# Patient Record
Sex: Female | Born: 1985 | Race: Black or African American | Hispanic: No | Marital: Single | Smoking: Former smoker
Health system: Southern US, Community
[De-identification: ages and names within clinical notes are randomized; demographics above are authoritative.]

---

## 2010-10-03 ENCOUNTER — Other Ambulatory Visit: Payer: Self-pay | Admitting: Obstetrics and Gynecology

## 2010-10-03 ENCOUNTER — Other Ambulatory Visit (HOSPITAL_COMMUNITY)
Admission: RE | Admit: 2010-10-03 | Discharge: 2010-10-03 | Disposition: A | Discharge status: Home / Self Care | Payer: Medicaid Other | Source: Ambulatory Visit | Attending: Obstetrics & Gynecology | Admitting: Obstetrics & Gynecology

## 2010-10-03 ENCOUNTER — Other Ambulatory Visit (HOSPITAL_BASED_OUTPATIENT_CLINIC_OR_DEPARTMENT_OTHER): Payer: Self-pay | Admitting: Obstetrics & Gynecology

## 2010-10-03 DIAGNOSIS — E049 Nontoxic goiter, unspecified: Secondary | ICD-10-CM

## 2010-10-03 DIAGNOSIS — Z113 Encounter for screening for infections with a predominantly sexual mode of transmission: Secondary | ICD-10-CM | POA: Insufficient documentation

## 2010-10-03 DIAGNOSIS — Z124 Encounter for screening for malignant neoplasm of cervix: Secondary | ICD-10-CM | POA: Insufficient documentation

## 2010-10-04 ENCOUNTER — Other Ambulatory Visit (HOSPITAL_BASED_OUTPATIENT_CLINIC_OR_DEPARTMENT_OTHER): Payer: Self-pay

## 2010-10-09 ENCOUNTER — Other Ambulatory Visit (HOSPITAL_BASED_OUTPATIENT_CLINIC_OR_DEPARTMENT_OTHER): Payer: Self-pay

## 2010-10-16 ENCOUNTER — Ambulatory Visit (HOSPITAL_BASED_OUTPATIENT_CLINIC_OR_DEPARTMENT_OTHER)
Admission: RE | Admit: 2010-10-16 | Discharge: 2010-10-16 | Disposition: A | Discharge status: Home / Self Care | Payer: Medicaid Other | Source: Ambulatory Visit | Attending: Obstetrics and Gynecology | Admitting: Obstetrics and Gynecology

## 2010-10-16 DIAGNOSIS — E049 Nontoxic goiter, unspecified: Secondary | ICD-10-CM | POA: Insufficient documentation

## 2011-02-24 ENCOUNTER — Emergency Department (HOSPITAL_COMMUNITY)
Admission: EM | Admit: 2011-02-24 | Discharge: 2011-02-24 | Disposition: A | Discharge status: Home / Self Care | Payer: Medicaid Other | Attending: Emergency Medicine | Admitting: Emergency Medicine

## 2011-02-24 ENCOUNTER — Emergency Department (HOSPITAL_COMMUNITY): Payer: Medicaid Other

## 2011-02-24 ENCOUNTER — Encounter: Payer: Self-pay | Admitting: *Deleted

## 2011-02-24 DIAGNOSIS — M25559 Pain in unspecified hip: Secondary | ICD-10-CM | POA: Insufficient documentation

## 2011-02-24 DIAGNOSIS — R079 Chest pain, unspecified: Secondary | ICD-10-CM | POA: Insufficient documentation

## 2011-02-24 DIAGNOSIS — Y92410 Unspecified street and highway as the place of occurrence of the external cause: Secondary | ICD-10-CM | POA: Insufficient documentation

## 2011-02-24 DIAGNOSIS — S0003XA Contusion of scalp, initial encounter: Secondary | ICD-10-CM | POA: Insufficient documentation

## 2011-02-24 DIAGNOSIS — T148XXA Other injury of unspecified body region, initial encounter: Secondary | ICD-10-CM | POA: Insufficient documentation

## 2011-02-24 MED ORDER — HYDROCODONE-ACETAMINOPHEN 5-325 MG PO TABS: 1.0000 | ORAL_TABLET | ORAL | Status: AC | PRN

## 2011-02-24 MED ORDER — CYCLOBENZAPRINE HCL 10 MG PO TABS: 10.0000 mg | ORAL_TABLET | Freq: Three times a day (TID) | ORAL | Status: AC | PRN

## 2011-02-24 MED ORDER — OXYCODONE-ACETAMINOPHEN 5-325 MG PO TABS
1.0000 | ORAL_TABLET | Freq: Once | ORAL | Status: AC
  Administered 2011-02-24: 1 via ORAL
  Filled 2011-02-24: qty 1

## 2011-02-24 MED ORDER — IBUPROFEN 800 MG PO TABS: 800.0000 mg | ORAL_TABLET | Freq: Three times a day (TID) | ORAL | Status: AC
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
## 2011-02-24 NOTE — ED Notes (Signed)
Bed:[facility]<BR> Expected date:<BR> Expected time:<BR> Means of arrival:<BR> Comments:<BR> EMS
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
## 2011-02-24 NOTE — ED Notes (Signed)
Patient given discharge instructions, information, prescriptions, and diet order. Patient states that they adequately understand discharge information given and to return to ED if symptoms return or worsen.     

## 2011-02-24 NOTE — ED Notes (Signed)
Pt ambulated to restroom with no assistance. ?

## 2011-02-27 NOTE — ED Provider Notes (Signed)
Medical screening examination/treatment/procedure(s) were performed by non-physician practitioner and as supervising physician I was immediately available for consultation/collaboration.   Rolan Bucco, MD 02/27/11 [phone]

## 2011-03-09 ENCOUNTER — Emergency Department: Payer: Self-pay | Admitting: Unknown Physician Specialty

## 2011-09-29 ENCOUNTER — Emergency Department: Payer: Self-pay | Admitting: *Deleted

## 2014-04-08 ENCOUNTER — Emergency Department: Payer: Self-pay | Admitting: Emergency Medicine

## 2014-04-08 LAB — URINALYSIS, COMPLETE
Bilirubin,UR: NEGATIVE
Blood: NEGATIVE
Glucose,UR: NEGATIVE mg/dL (ref 0–75)
Ketone: NEGATIVE
NITRITE: POSITIVE
PH: 6 (ref 4.5–8.0)
PROTEIN: NEGATIVE
Specific Gravity: 1.024 (ref 1.003–1.030)
Squamous Epithelial: 4
WBC UR: 11 /HPF (ref 0–5)

## 2014-04-08 LAB — COMPREHENSIVE METABOLIC PANEL
ALBUMIN: 3.5 g/dL (ref 3.4–5.0)
ALK PHOS: 91 U/L (ref 46–116)
Anion Gap: 5 — ABNORMAL LOW (ref 7–16)
BILIRUBIN TOTAL: 0.2 mg/dL (ref 0.2–1.0)
BUN: 17 mg/dL (ref 7–18)
CHLORIDE: 108 mmol/L — AB (ref 98–107)
CO2: 26 mmol/L (ref 21–32)
Calcium, Total: 8.7 mg/dL (ref 8.5–10.1)
Creatinine: 0.81 mg/dL (ref 0.60–1.30)
EGFR (African American): >60
EGFR (Non-African Amer.): >60
Glucose: 77 mg/dL (ref 65–99)
OSMOLALITY: 278 (ref 275–301)
Potassium: 3.8 mmol/L (ref 3.5–5.1)
SGOT(AST): 21 U/L (ref 15–37)
SGPT (ALT): 13 U/L — ABNORMAL LOW (ref 14–63)
Sodium: 139 mmol/L (ref 136–145)
TOTAL PROTEIN: 8 g/dL (ref 6.4–8.2)

## 2014-04-08 LAB — CBC WITH DIFFERENTIAL/PLATELET
BASOS ABS: 0.1 10*3/uL (ref 0.0–0.1)
BASOS PCT: 0.8 %
EOS PCT: 5 %
Eosinophil #: 0.4 10*3/uL (ref 0.0–0.7)
HCT: 40.6 % (ref 35.0–47.0)
HGB: 13.1 g/dL (ref 12.0–16.0)
LYMPHS ABS: 2.8 10*3/uL (ref 1.0–3.6)
LYMPHS PCT: 32.7 %
MCH: 28 pg (ref 26.0–34.0)
MCHC: 32.2 g/dL (ref 32.0–36.0)
MCV: 87 fL (ref 80–100)
MONOS PCT: 7.1 %
Monocyte #: 0.6 x10 3/mm (ref 0.2–0.9)
NEUTROS ABS: 4.6 10*3/uL (ref 1.4–6.5)
Neutrophil %: 54.4 %
Platelet: 290 10*3/uL (ref 150–440)
RBC: 4.68 10*6/uL (ref 3.80–5.20)
RDW: 13.2 % (ref 11.5–14.5)
WBC: 8.4 10*3/uL (ref 3.6–11.0)

## 2014-04-09 LAB — GC/CHLAMYDIA PROBE AMP

## 2014-04-09 LAB — WET PREP, GENITAL

## 2014-08-05 ENCOUNTER — Emergency Department: Payer: Medicaid Other

## 2014-08-05 ENCOUNTER — Encounter: Payer: Self-pay | Admitting: *Deleted

## 2014-08-05 ENCOUNTER — Other Ambulatory Visit: Payer: Self-pay

## 2014-08-05 ENCOUNTER — Emergency Department
Admission: EM | Admit: 2014-08-05 | Discharge: 2014-08-05 | Disposition: A | Discharge status: Home / Self Care | Payer: Medicaid Other | Attending: Student | Admitting: Student

## 2014-08-05 DIAGNOSIS — R112 Nausea with vomiting, unspecified: Secondary | ICD-10-CM

## 2014-08-05 DIAGNOSIS — R109 Unspecified abdominal pain: Secondary | ICD-10-CM | POA: Diagnosis present

## 2014-08-05 DIAGNOSIS — Z3202 Encounter for pregnancy test, result negative: Secondary | ICD-10-CM | POA: Diagnosis not present

## 2014-08-05 DIAGNOSIS — Z87891 Personal history of nicotine dependence: Secondary | ICD-10-CM | POA: Diagnosis not present

## 2014-08-05 DIAGNOSIS — Z880 Allergy status to penicillin: Secondary | ICD-10-CM | POA: Diagnosis not present

## 2014-08-05 DIAGNOSIS — B349 Viral infection, unspecified: Secondary | ICD-10-CM | POA: Diagnosis not present

## 2014-08-05 DIAGNOSIS — R0789 Other chest pain: Secondary | ICD-10-CM | POA: Diagnosis not present

## 2014-08-05 LAB — COMPREHENSIVE METABOLIC PANEL
ALBUMIN: 4 g/dL (ref 3.5–5.0)
ALK PHOS: 103 U/L (ref 38–126)
ALT: 19 U/L (ref 14–54)
AST: 22 U/L (ref 15–41)
Anion gap: 6 (ref 5–15)
BILIRUBIN TOTAL: 0.3 mg/dL (ref 0.3–1.2)
BUN: 15 mg/dL (ref 6–20)
CALCIUM: 8.6 mg/dL — AB (ref 8.9–10.3)
CO2: 27 mmol/L (ref 22–32)
Chloride: 104 mmol/L (ref 101–111)
Creatinine, Ser: 0.79 mg/dL (ref 0.44–1.00)
GFR calc non Af Amer: >60 mL/min (ref >60)
GLUCOSE: 88 mg/dL (ref 65–99)
Potassium: 3.4 mmol/L — ABNORMAL LOW (ref 3.5–5.1)
Sodium: 137 mmol/L (ref 135–145)
Total Protein: 8.4 g/dL — ABNORMAL HIGH (ref 6.5–8.1)

## 2014-08-05 LAB — CBC WITH DIFFERENTIAL/PLATELET
Basophils Absolute: 0 10*3/uL (ref 0–0.1)
Basophils Relative: 1 %
EOS PCT: 6 %
Eosinophils Absolute: 0.5 10*3/uL (ref 0–0.7)
HCT: 39.8 % (ref 35.0–47.0)
HEMOGLOBIN: 12.8 g/dL (ref 12.0–16.0)
LYMPHS PCT: 23 %
Lymphs Abs: 1.8 10*3/uL (ref 1.0–3.6)
MCH: 28 pg (ref 26.0–34.0)
MCHC: 32.2 g/dL (ref 32.0–36.0)
MCV: 86.7 fL (ref 80.0–100.0)
MONO ABS: 0.6 10*3/uL (ref 0.2–0.9)
MONOS PCT: 7 %
NEUTROS ABS: 5.3 10*3/uL (ref 1.4–6.5)
NEUTROS PCT: 65 %
Platelets: 301 10*3/uL (ref 150–440)
RBC: 4.59 MIL/uL (ref 3.80–5.20)
RDW: 13.3 % (ref 11.5–14.5)
WBC: 8.2 10*3/uL (ref 3.6–11.0)

## 2014-08-05 LAB — POCT PREGNANCY, URINE: PREG TEST UR: NEGATIVE

## 2014-08-05 LAB — URINALYSIS COMPLETE WITH MICROSCOPIC (ARMC ONLY)
BILIRUBIN URINE: NEGATIVE
Glucose, UA: NEGATIVE mg/dL
KETONES UR: NEGATIVE mg/dL
LEUKOCYTES UA: NEGATIVE
Nitrite: NEGATIVE
PH: 6 (ref 5.0–8.0)
Protein, ur: NEGATIVE mg/dL
Specific Gravity, Urine: 1.015 (ref 1.005–1.030)

## 2014-08-05 LAB — TROPONIN I

## 2014-08-05 LAB — LIPASE, BLOOD: LIPASE: 35 U/L (ref 22–51)

## 2014-08-05 MED ORDER — ONDANSETRON 4 MG PO TBDP: 4.0000 mg | ORAL_TABLET | Freq: Three times a day (TID) | ORAL | Status: DC | PRN

## 2014-08-05 MED ORDER — IBUPROFEN 600 MG PO TABS
ORAL_TABLET | ORAL | Status: AC
  Administered 2014-08-05: 600 mg via ORAL
  Filled 2014-08-05: qty 1

## 2014-08-05 MED ORDER — IBUPROFEN 600 MG PO TABS: 600.0000 mg | ORAL_TABLET | Freq: Four times a day (QID) | ORAL | Status: DC | PRN

## 2014-08-05 MED ORDER — IBUPROFEN 600 MG PO TABS
600.0000 mg | ORAL_TABLET | Freq: Once | ORAL | Status: AC
  Administered 2014-08-05: 600 mg via ORAL

## 2014-08-05 MED ORDER — BENZONATATE 100 MG PO CAPS: 100.0000 mg | ORAL_CAPSULE | Freq: Three times a day (TID) | ORAL | Status: DC | PRN

## 2014-08-05 MED ORDER — ACETAMINOPHEN 500 MG PO TABS
1000.0000 mg | ORAL_TABLET | Freq: Once | ORAL | Status: AC
  Administered 2014-08-05: 1000 mg via ORAL

## 2014-08-05 MED ORDER — ONDANSETRON 4 MG PO TBDP
4.0000 mg | ORAL_TABLET | Freq: Once | ORAL | Status: AC
  Administered 2014-08-05: 4 mg via ORAL

## 2014-08-05 MED ORDER — ONDANSETRON 4 MG PO TBDP
ORAL_TABLET | ORAL | Status: AC
  Administered 2014-08-05: 4 mg via ORAL
  Filled 2014-08-05: qty 1

## 2014-08-05 MED ORDER — ACETAMINOPHEN 500 MG PO TABS
ORAL_TABLET | ORAL | Status: AC
Start: 2014-08-05 — End: 2014-08-05
  Administered 2014-08-05: 1000 mg via ORAL
  Filled 2014-08-05: qty 2

## 2014-08-05 NOTE — ED Provider Notes (Signed)
Laura Salinas  ____________________________________________  Time seen: Approximately 12:07 PM  I have reviewed the triage vital signs and the nursing notes.   HISTORY  Chief Complaint Abdominal Pain    HPI Laura Salinas is a 29 y.o. female with no chronic medical problems who presents for evaluation of 2 days of dry cough, runny nose, intermittent sharp chest pain, cramping abdominal pain, nonbloody nonbilious emesis. Last night she had intermittent sharp chest pains in the middle of her chest which did not radiate and lasted only a few seconds before they resolved. She is not had any chest pain today. The remainder of her symptoms have been constant since onset. He reports she has felt "hot and cold" but has not documented a fever with a thermometer. She denies any sick contacts. No diarrhea. No abnormal vaginal bleeding or vaginal discharge. She reported that her chest pain last night was not pleuritic but every time she tried to take a deep breath, it would make her cough. No modifying factors. Current severity is moderate.   History reviewed. No pertinent past medical history.  There are no active problems to display for this patient.   History reviewed. No pertinent past surgical history.  Current Outpatient Rx  Name  Route  Sig  Dispense  Refill  . benzonatate (TESSALON PERLES) 100 MG capsule   Oral   Take 1 capsule (100 mg total) by mouth 3 (three) times daily as needed for cough.   15 capsule   0   . ibuprofen (ADVIL,MOTRIN) 600 MG tablet   Oral   Take 1 tablet (600 mg total) by mouth every 6 (six) hours as needed for moderate pain.   15 tablet   0   . ondansetron (ZOFRAN ODT) 4 MG disintegrating tablet   Oral   Take 1 tablet (4 mg total) by mouth every 8 (eight) hours as needed for nausea or vomiting.   12 tablet   0     Allergies Penicillins  No family history on file.  Social History History   Substance Use Topics  . Smoking status: Former Games developermoker  . Smokeless tobacco: Not on file  . Alcohol Use: No    Review of Systems Constitutional: + subjective fever/chills Eyes: No visual changes. ENT: No sore throat. Cardiovascular: + chest pain. Respiratory: Denies shortness of breath. Gastrointestinal: + abdominal pain.  + nausea, + vomiting.  No diarrhea.  No constipation. Genitourinary: Negative for dysuria. Musculoskeletal: Negative for back pain. Skin: Negative for rash. Neurological: Negative for headaches, focal weakness or numbness.  10-point ROS otherwise negative.  ____________________________________________   PHYSICAL EXAM:  VITAL SIGNS: ED Triage Vitals  Enc Vitals Group     BP 08/05/14 0748 132/7 mmHg     Pulse Rate 08/05/14 0748 71     Resp 08/05/14 0748 20     Temp 08/05/14 0748 97.6 F (36.4 C)     Temp Source 08/05/14 0748 Oral     SpO2 08/05/14 1114 100 %     Weight 08/05/14 0748 162 lb (73.483 kg)     Height 08/05/14 0748 4\' 9"  (1.448 m)     Head Cir --      Peak Flow --      Pain Score 08/05/14 0749 8     Pain Loc --      Pain Edu? --      Excl. in GC? --     Constitutional: Alert and oriented. Well appearing and in  no acute distress. Frequent dry cough. Eyes: Conjunctivae are normal. PERRL. EOMI. Head: Atraumatic. Nose: + congestion/clear rhinnorhea. Mouth/Throat: Mucous membranes are moist.  Oropharynx non-erythematous. Neck: No stridor.   Cardiovascular: Normal rate, regular rhythm. Grossly normal heart sounds.  Good peripheral circulation. Respiratory: Normal respiratory effort.  No retractions. Lungs CTAB. Gastrointestinal: Soft and nontender. No distention. No abdominal bruits. No CVA tenderness. Genitourinary: deferred Musculoskeletal: No lower extremity tenderness nor edema.  No joint effusions. Neurologic:  Normal speech and language. No gross focal neurologic deficits are appreciated. Speech is normal. No gait  instability. Skin:  Skin is warm, dry and intact. No rash noted. Psychiatric: Mood and affect are normal. Speech and behavior are normal.  ____________________________________________   LABS (all labs ordered are listed, but only abnormal results are displayed)  Labs Reviewed  COMPREHENSIVE METABOLIC PANEL - Abnormal; Notable for the following:    Potassium 3.4 (*)    Calcium 8.6 (*)    Total Protein 8.4 (*)    All other components within normal limits  URINALYSIS COMPLETEWITH MICROSCOPIC ([facility] ONLY) - Abnormal; Notable for the following:    Color, Urine YELLOW (*)    APPearance HAZY (*)    Hgb urine dipstick 1+ (*)    Bacteria, UA RARE (*)    Squamous Epithelial / LPF 6-30 (*)    All other components within normal limits  CBC WITH DIFFERENTIAL/PLATELET  LIPASE, BLOOD  TROPONIN I  POC URINE PREG, ED  POCT PREGNANCY, URINE   ____________________________________________  EKG  ED ECG REPORT I, Gayla Doss, the attending physician, personally viewed and interpreted this ECG.   Date: 08/05/2014  EKG Time:12:27  Rate: 57  Rhythm: sinus bradycardia  Axis: normal  Intervals:none, normal intervals  ST&T Change: TWI in V2, no acute ST segment change  ____________________________________________  RADIOLOGY  CXR  IMPRESSION: No abnormality noted. _________________________________________   PROCEDURES  Procedure(s) performed: None  Critical Care performed: No  ____________________________________________   INITIAL IMPRESSION / ASSESSMENT AND PLAN / ED COURSE  Pertinent labs & imaging results that were available during my care of the patient were reviewed by me and considered in my medical decision making (see chart for details).  Laura Salinas is a 29 y.o. female with no chronic medical problems who presents for evaluation of 2 days of dry cough, runny nose, intermittent sharp chest pain, cramping abdominal pain, nonbloody nonbilious emesis. On exam, she is  generally well-appearing and in no acute distress. Vital signs stable, afebrile. Benign exam. Labs reviewed and are unremarkable. Suspect viral illness as a cause of her symptoms. We'll treat symptomatically. She has no risk factors for early coronary artery disease, no history of early coronary artery disease in her family. Troponin negative. EKG negative for any acute ischemic change. Doubt ACS. PERC negative, doubt PE. Not consistent with acute aortic dissection. CXR pending then anticpate dc home.  ----------------------------------------- 1:39 PM on 08/05/2014 -----------------------------------------  Patient with symptomatically improvement. Vital signs stable. Tolerating by mouth intake. Discharge with return precautions, motrin, tessalon perles, zofran. Chest x-ray clear. ____________________________________________   FINAL CLINICAL IMPRESSION(S) / ED DIAGNOSES  Final diagnoses:  Viral syndrome  Non-intractable vomiting with nausea, vomiting of unspecified type  Abdominal cramping  Atypical chest pain      Gayla Doss, MD 08/05/14 1340

## 2015-05-14 ENCOUNTER — Emergency Department (HOSPITAL_BASED_OUTPATIENT_CLINIC_OR_DEPARTMENT_OTHER)
Admission: EM | Admit: 2015-05-14 | Discharge: 2015-05-14 | Disposition: A | Discharge status: Home / Self Care | Payer: Medicaid Other | Attending: Emergency Medicine | Admitting: Emergency Medicine

## 2015-05-14 ENCOUNTER — Encounter (HOSPITAL_BASED_OUTPATIENT_CLINIC_OR_DEPARTMENT_OTHER): Payer: Self-pay | Admitting: *Deleted

## 2015-05-14 ENCOUNTER — Emergency Department (HOSPITAL_BASED_OUTPATIENT_CLINIC_OR_DEPARTMENT_OTHER): Payer: Medicaid Other

## 2015-05-14 DIAGNOSIS — N12 Tubulo-interstitial nephritis, not specified as acute or chronic: Secondary | ICD-10-CM | POA: Insufficient documentation

## 2015-05-14 DIAGNOSIS — J111 Influenza due to unidentified influenza virus with other respiratory manifestations: Secondary | ICD-10-CM | POA: Insufficient documentation

## 2015-05-14 DIAGNOSIS — Z87891 Personal history of nicotine dependence: Secondary | ICD-10-CM | POA: Diagnosis not present

## 2015-05-14 DIAGNOSIS — Z880 Allergy status to penicillin: Secondary | ICD-10-CM | POA: Insufficient documentation

## 2015-05-14 DIAGNOSIS — Z3202 Encounter for pregnancy test, result negative: Secondary | ICD-10-CM | POA: Insufficient documentation

## 2015-05-14 DIAGNOSIS — R05 Cough: Secondary | ICD-10-CM | POA: Diagnosis present

## 2015-05-14 DIAGNOSIS — H9209 Otalgia, unspecified ear: Secondary | ICD-10-CM | POA: Diagnosis not present

## 2015-05-14 DIAGNOSIS — R69 Illness, unspecified: Secondary | ICD-10-CM

## 2015-05-14 LAB — PREGNANCY, URINE: PREG TEST UR: NEGATIVE

## 2015-05-14 LAB — URINALYSIS, ROUTINE W REFLEX MICROSCOPIC
BILIRUBIN URINE: NEGATIVE
Glucose, UA: NEGATIVE mg/dL
Ketones, ur: NEGATIVE mg/dL
NITRITE: NEGATIVE
PH: 6.5 (ref 5.0–8.0)
Protein, ur: NEGATIVE mg/dL
SPECIFIC GRAVITY, URINE: 1.029 (ref 1.005–1.030)

## 2015-05-14 LAB — URINE MICROSCOPIC-ADD ON

## 2015-05-14 LAB — RAPID STREP SCREEN (MED CTR MEBANE ONLY): Streptococcus, Group A Screen (Direct): NEGATIVE

## 2015-05-14 MED ORDER — CEPHALEXIN 500 MG PO CAPS: 500.0000 mg | ORAL_CAPSULE | Freq: Three times a day (TID) | ORAL | Status: AC

## 2015-05-14 MED ORDER — BENZONATATE 100 MG PO CAPS: 100.0000 mg | ORAL_CAPSULE | Freq: Three times a day (TID) | ORAL | Status: DC | PRN

## 2015-05-14 MED ORDER — KETOROLAC TROMETHAMINE 60 MG/2ML IM SOLN
60.0000 mg | Freq: Once | INTRAMUSCULAR | Status: AC
  Administered 2015-05-14: 60 mg via INTRAMUSCULAR
  Filled 2015-05-14: qty 2

## 2015-05-14 MED ORDER — ONDANSETRON 4 MG PO TBDP
4.0000 mg | ORAL_TABLET | Freq: Once | ORAL | Status: AC
  Administered 2015-05-14: 4 mg via ORAL
  Filled 2015-05-14: qty 1

## 2015-05-14 MED ORDER — NAPROXEN 500 MG PO TABS: 500.0000 mg | ORAL_TABLET | Freq: Two times a day (BID) | ORAL | Status: DC

## 2015-05-14 MED ORDER — ONDANSETRON 4 MG PO TBDP: 4.0000 mg | ORAL_TABLET | Freq: Three times a day (TID) | ORAL | Status: DC | PRN

## 2015-05-14 NOTE — Discharge Instructions (Signed)
Influenza, Adult °Influenza ("the flu") is a viral infection of the respiratory tract. It occurs more often in winter months because people spend more time in close contact with one another. Influenza can make you feel very sick. Influenza easily spreads from person to person (contagious). °CAUSES  °Influenza is caused by a virus that infects the respiratory tract. You can catch the virus by breathing in droplets from an infected person's cough or sneeze. You can also catch the virus by touching something that was recently contaminated with the virus and then touching your mouth, nose, or eyes. °RISKS AND COMPLICATIONS °You may be at risk for a more severe case of influenza if you smoke cigarettes, have diabetes, have chronic heart disease (such as heart failure) or lung disease (such as asthma), or if you have a weakened immune system. Elderly people and pregnant women are also at risk for more serious infections. The most common problem of influenza is a lung infection (pneumonia). Sometimes, this problem can require emergency medical care and may be life threatening. °SIGNS AND SYMPTOMS  °Symptoms typically last 4 to 10 days and may include: °· Fever. °· Chills. °· Headache, body aches, and muscle aches. °· Sore throat. °· Chest discomfort and cough. °· Poor appetite. °· Weakness or feeling tired. °· Dizziness. °· Nausea or vomiting. °DIAGNOSIS  °Diagnosis of influenza is often made based on your history and a physical exam. A nose or throat swab test can be done to confirm the diagnosis. °TREATMENT  °In mild cases, influenza goes away on its own. Treatment is directed at relieving symptoms. For more severe cases, your health care provider may prescribe antiviral medicines to shorten the sickness. Antibiotic medicines are not effective because the infection is caused by a virus, not by bacteria. °HOME CARE INSTRUCTIONS °· Take medicines only as directed by your health care provider. °· Use a cool mist humidifier  to make breathing easier. °· Get plenty of rest until your temperature returns to normal. This usually takes 3 to 4 days. °· Drink enough fluid to keep your urine clear or pale yellow. °· Cover your mouth and nose when coughing or sneezing, and wash your hands well to prevent the virus from spreading. °· Stay home from work or school until the fever is gone for at least 1 full day. °PREVENTION  °An annual influenza vaccination (flu shot) is the best way to avoid getting influenza. An annual flu shot is now routinely recommended for all adults in the [place] °SEEK MEDICAL CARE IF: °· You experience chest pain, your cough worsens, or you produce more mucus. °· You have nausea, vomiting, or diarrhea. °· Your fever returns or gets worse. °SEEK IMMEDIATE MEDICAL CARE IF: °· You have trouble breathing, you become short of breath, or your skin or nails become bluish. °· You have severe pain or stiffness in the neck. °· You develop a sudden headache, or pain in the face or ear. °· You have nausea or vomiting that you cannot control. °MAKE SURE YOU:  °· Understand these instructions. °· Will watch your condition. °· Will get help right away if you are not doing well or get worse. °  °This information is not intended to replace advice given to you by your health care provider. Make sure you discuss any questions you have with your health care provider. °  °Document Released: 02/16/2000 Document Revised: 03/11/2014 Document Reviewed: 05/20/2011 °Elsevier Interactive Patient Education ©2016 [employer] ° °Cough, Adult °Coughing is a reflex that clears your throat and your airways. Coughing helps to heal and protect your lungs. It is normal to cough occasionally, but a cough that happens with other   symptoms or lasts a long time may be a sign of a condition that needs treatment. A cough may last only 2-3 weeks (acute), or it may last longer than 8 weeks (chronic). °CAUSES °Coughing is commonly caused by: °· Breathing in substances  that irritate your lungs. °· A viral or bacterial respiratory infection. °· Allergies. °· Asthma. °· Postnasal drip. °· Smoking. °· Acid backing up from the stomach into the esophagus (gastroesophageal reflux). °· Certain medicines. °· Chronic lung problems, including COPD (or rarely, lung cancer). °· Other medical conditions such as heart failure. °HOME CARE INSTRUCTIONS  °Pay attention to any changes in your symptoms. Take these actions to help with your discomfort: °· Take medicines only as told by your health care provider. °¨ If you were prescribed an antibiotic medicine, take it as told by your health care provider. Do not stop taking the antibiotic even if you start to feel better. °¨ Talk with your health care provider before you take a cough suppressant medicine. °· Drink enough fluid to keep your urine clear or pale yellow. °· If the air is dry, use a cold steam vaporizer or humidifier in your bedroom or your home to help loosen secretions. °· Avoid anything that causes you to cough at work or at home. °· If your cough is worse at night, try sleeping in a semi-upright position. °· Avoid cigarette smoke. If you smoke, quit smoking. If you need help quitting, ask your health care provider. °· Avoid caffeine. °· Avoid alcohol. °· Rest as needed. °SEEK MEDICAL CARE IF:  °· You have new symptoms. °· You cough up pus. °· Your cough does not get better after 2-3 weeks, or your cough gets worse. °· You cannot control your cough with suppressant medicines and you are losing sleep. °· You develop pain that is getting worse or pain that is not controlled with pain medicines. °· You have a fever. °· You have unexplained weight loss. °· You have night sweats. °SEEK IMMEDIATE MEDICAL CARE IF: °· You cough up blood. °· You have difficulty breathing. °· Your heartbeat is very fast. °  °This information is not intended to replace advice given to you by your health care provider. Make sure you discuss any questions you have  with your health care provider. °  °Document Released: 08/17/2010 Document Revised: 11/09/2014 Document Reviewed: 04/27/2014 °Elsevier Interactive Patient Education ©2016 [employer] ° °

## 2015-05-14 NOTE — ED Notes (Signed)
C/o nasal congestion (obvious severe), facial pain, HA, and body aches, "feel hot and cold", subjective fever, productive cough, and nv. V x3 in last 24 hrs. (denies: ear ache, sore throat, diarrhea, dizziness, bleeding, urinary sx or vaginal sx), last ate at 0100, last BM yesterday (normal), took iibuprofen 400mg  at 1800, no meds PTA, "PCP is [facility]".

## 2015-05-14 NOTE — ED Notes (Signed)
DC instructions reviewed with pt, also discussed each rx as provided by EDP, stressed importance of hand washing and completing all of the abx as prescribed. Opportunity for questions provided

## 2015-05-14 NOTE — ED Provider Notes (Signed)
CSN: [id number]     Arrival date & time 05/14/15  4098 History   First MD Initiated Contact with Patient 05/14/15 [phone]     Chief Complaint  Patient presents with  . URI     (Consider location/radiation/quality/duration/timing/severity/associated sxs/prior Treatment) Patient is a 30 y.o. female presenting with URI.  URI Presenting symptoms: congestion, cough, ear pain, fatigue, fever (subjective), rhinorrhea and sore throat   Cough:    Cough characteristics:  Productive   Sputum characteristics:  Green and yellow   Duration:  1 day   Timing:  Constant   Chronicity:  New Associated symptoms: headaches and myalgias   Associated symptoms: no neck pain   Risk factors: sick contacts   Risk factors: no chronic respiratory disease and no diabetes mellitus     History reviewed. No pertinent past medical history. History reviewed. No pertinent past surgical history. No family history on file. Social History  Substance Use Topics  . Smoking status: Former Games developer  . Smokeless tobacco: None  . Alcohol Use: No   OB History    No data available     Review of Systems  Constitutional: Positive for fever (subjective) and fatigue.  HENT: Positive for congestion, ear pain, rhinorrhea and sore throat.   Eyes: Negative for visual disturbance.  Respiratory: Positive for cough. Negative for shortness of breath.   Cardiovascular: Negative for chest pain.  Gastrointestinal: Positive for nausea, vomiting (3x) and abdominal pain (generalized). Negative for diarrhea.  Genitourinary: Negative for difficulty urinating.  Musculoskeletal: Positive for myalgias. Negative for back pain and neck pain.  Skin: Negative for rash.  Neurological: Positive for headaches. Negative for syncope.      Allergies  Penicillins  Home Medications   Prior to Admission medications   Medication Sig Start Date End Date Taking? Authorizing Provider  benzonatate (TESSALON PERLES) 100 MG capsule Take 1 capsule  (100 mg total) by mouth 3 (three) times daily as needed for cough. 05/14/15 05/13/16  Alvira Monday, MD  cephALEXin (KEFLEX) 500 MG capsule Take 1 capsule (500 mg total) by mouth 3 (three) times daily. 05/14/15 05/28/15  Alvira Monday, MD  ibuprofen (ADVIL,MOTRIN) 600 MG tablet Take 1 tablet (600 mg total) by mouth every 6 (six) hours as needed for moderate pain. 08/05/14   Gayla Doss, MD  naproxen (NAPROSYN) 500 MG tablet Take 1 tablet (500 mg total) by mouth 2 (two) times daily with a meal. 05/14/15   Alvira Monday, MD  ondansetron (ZOFRAN ODT) 4 MG disintegrating tablet Take 1 tablet (4 mg total) by mouth every 8 (eight) hours as needed for nausea or vomiting. 05/14/15   Alvira Monday, MD   BP 127/81 mmHg  Temp(Src) 98.6 F (37 C) (Oral)  Resp 18  Ht  (1.448 m)  Wt 160 lb (72.576 kg)  BMI 34.61 kg/m2  SpO2 97%  LMP 05/13/2015 Physical Exam  Constitutional: She is oriented to person, place, and time. She appears well-developed and well-nourished. No distress.  HENT:  Head: Normocephalic and atraumatic.  Eyes: Conjunctivae and EOM are normal.  Neck: Normal range of motion.  Cardiovascular: Normal rate, regular rhythm, normal heart sounds and intact distal pulses.  Exam reveals no gallop and no friction rub.   No murmur heard. Pulmonary/Chest: Effort normal and breath sounds normal. No respiratory distress. She has no wheezes. She has no rales.  Abdominal: Soft. She exhibits no distension. There is no tenderness. There is no guarding.  Musculoskeletal: She exhibits no edema or tenderness.  Neurological:  She is alert and oriented to person, place, and time.  Skin: Skin is warm and dry. No rash noted. She is not diaphoretic. No erythema.  Nursing note and vitals reviewed.   ED Course  Procedures (including critical care time) Labs Review Labs Reviewed  URINALYSIS, ROUTINE W REFLEX MICROSCOPIC (NOT AT [facility]) - Abnormal; Notable for the following:    APPearance CLOUDY (*)     Hgb urine dipstick TRACE (*)    Leukocytes, UA SMALL (*)    All other components within normal limits  URINE MICROSCOPIC-ADD ON - Abnormal; Notable for the following:    Squamous Epithelial / LPF 0-5 (*)    Bacteria, UA MANY (*)    All other components within normal limits  RAPID STREP SCREEN (NOT AT [facility])  CULTURE, GROUP A STREP [facility]  PREGNANCY, URINE    Imaging Review Dg Chest 2 View  05/14/2015  CLINICAL DATA:  Cough, congestion, fever, and body aches for 1 day. EXAM: CHEST - 2 VIEW COMPARISON:  Two-view chest x-ray 08/05/2014 FINDINGS: The heart size is normal. The lungs are clear. There is no edema or effusion. No focal airspace consolidation is evident. The visualized soft tissues and bony thorax are unremarkable. IMPRESSION: Negative two view chest x-ray Electronically Signed   By: Marin Robertshristopher  Mattern M.D.   On: 05/14/2015 07:42   I have personally reviewed and evaluated these images and lab results as part of my medical decision-making.   EKG Interpretation None      MDM   Final diagnoses:  Influenza-like illness  Pyelonephritis   29yo female with no significant medical history presents with cough, body aches, sore throat, congestion, nausea and vomiting.  Chest XR shows no evidence of pneumonia. Pregnancy test negative.  Strep screen negative.  No sign of otitis media. Abdominal exam benign and doubt acute intraabdominal pathology.  Constellation of symptoms concerning for viral syndrome, likely influenza. However, urinalysis concerning for UTI, and symptoms of chills/n/v/abd pain may represent pyelonephritis. Given zofran, toradol.  Given rx for keflex for 14 days, zofran, naproxen, tessalon and discussed supportive care. Patient discharged in stable condition with understanding of reasons to return.    Alvira MondayErin Havannah Streat, MD 05/14/15 [phone]

## 2015-05-14 NOTE — ED Notes (Signed)
Patient transported to X-ray 

## 2015-05-16 LAB — CULTURE, GROUP A STREP (THRC)

## 2015-06-26 ENCOUNTER — Emergency Department (HOSPITAL_BASED_OUTPATIENT_CLINIC_OR_DEPARTMENT_OTHER)
Admission: EM | Admit: 2015-06-26 | Discharge: 2015-06-26 | Disposition: A | Discharge status: Home / Self Care | Payer: Medicaid Other | Attending: Emergency Medicine | Admitting: Emergency Medicine

## 2015-06-26 ENCOUNTER — Encounter (HOSPITAL_BASED_OUTPATIENT_CLINIC_OR_DEPARTMENT_OTHER): Payer: Self-pay | Admitting: *Deleted

## 2015-06-26 DIAGNOSIS — Z880 Allergy status to penicillin: Secondary | ICD-10-CM | POA: Diagnosis not present

## 2015-06-26 DIAGNOSIS — R42 Dizziness and giddiness: Secondary | ICD-10-CM | POA: Diagnosis not present

## 2015-06-26 DIAGNOSIS — Z87891 Personal history of nicotine dependence: Secondary | ICD-10-CM | POA: Diagnosis not present

## 2015-06-26 DIAGNOSIS — R110 Nausea: Secondary | ICD-10-CM | POA: Insufficient documentation

## 2015-06-26 DIAGNOSIS — Z3202 Encounter for pregnancy test, result negative: Secondary | ICD-10-CM | POA: Insufficient documentation

## 2015-06-26 DIAGNOSIS — R200 Anesthesia of skin: Secondary | ICD-10-CM | POA: Diagnosis not present

## 2015-06-26 DIAGNOSIS — R1012 Left upper quadrant pain: Secondary | ICD-10-CM | POA: Diagnosis not present

## 2015-06-26 DIAGNOSIS — R197 Diarrhea, unspecified: Secondary | ICD-10-CM | POA: Insufficient documentation

## 2015-06-26 LAB — URINALYSIS, ROUTINE W REFLEX MICROSCOPIC
Bilirubin Urine: NEGATIVE
Glucose, UA: NEGATIVE mg/dL
Hgb urine dipstick: NEGATIVE
Ketones, ur: NEGATIVE mg/dL
LEUKOCYTES UA: NEGATIVE
NITRITE: NEGATIVE
PROTEIN: NEGATIVE mg/dL
Specific Gravity, Urine: 1.025 (ref 1.005–1.030)
pH: 7 (ref 5.0–8.0)

## 2015-06-26 LAB — COMPREHENSIVE METABOLIC PANEL
ALT: 13 U/L — ABNORMAL LOW (ref 14–54)
ANION GAP: 9 (ref 5–15)
AST: 19 U/L (ref 15–41)
Albumin: 3.7 g/dL (ref 3.5–5.0)
Alkaline Phosphatase: 80 U/L (ref 38–126)
BUN: 26 mg/dL — ABNORMAL HIGH (ref 6–20)
CHLORIDE: 105 mmol/L (ref 101–111)
CO2: 21 mmol/L — ABNORMAL LOW (ref 22–32)
Calcium: 8.1 mg/dL — ABNORMAL LOW (ref 8.9–10.3)
Creatinine, Ser: 0.73 mg/dL (ref 0.44–1.00)
Glucose, Bld: 83 mg/dL (ref 65–99)
POTASSIUM: 3.6 mmol/L (ref 3.5–5.1)
Sodium: 135 mmol/L (ref 135–145)
Total Bilirubin: 0.5 mg/dL (ref 0.3–1.2)
Total Protein: 7.6 g/dL (ref 6.5–8.1)

## 2015-06-26 LAB — CBC WITH DIFFERENTIAL/PLATELET
BASOS PCT: 1 %
Basophils Absolute: 0 10*3/uL (ref 0.0–0.1)
EOS ABS: 0.3 10*3/uL (ref 0.0–0.7)
EOS PCT: 4 %
HEMATOCRIT: 37.2 % (ref 36.0–46.0)
HEMOGLOBIN: 12.2 g/dL (ref 12.0–15.0)
LYMPHS ABS: 2.6 10*3/uL (ref 0.7–4.0)
Lymphocytes Relative: 32 %
MCH: 28.9 pg (ref 26.0–34.0)
MCHC: 32.8 g/dL (ref 30.0–36.0)
MCV: 88.2 fL (ref 78.0–100.0)
MONOS PCT: 11 %
Monocytes Absolute: 0.9 10*3/uL (ref 0.1–1.0)
NEUTROS PCT: 52 %
Neutro Abs: 4.3 10*3/uL (ref 1.7–7.7)
PLATELETS: 356 10*3/uL (ref 150–400)
RBC: 4.22 MIL/uL (ref 3.87–5.11)
RDW: 12.7 % (ref 11.5–15.5)
WBC: 8.2 10*3/uL (ref 4.0–10.5)

## 2015-06-26 LAB — LIPASE, BLOOD: LIPASE: 18 U/L (ref 11–51)

## 2015-06-26 LAB — PREGNANCY, URINE: PREG TEST UR: NEGATIVE

## 2015-06-26 MED ORDER — METOCLOPRAMIDE HCL 10 MG PO TABS
10.0000 mg | ORAL_TABLET | Freq: Once | ORAL | Status: AC
  Administered 2015-06-26: 10 mg via ORAL
  Filled 2015-06-26: qty 1

## 2015-06-26 MED ORDER — METOCLOPRAMIDE HCL 10 MG PO TABS: 10.0000 mg | ORAL_TABLET | Freq: Three times a day (TID) | ORAL | Status: DC | PRN

## 2015-06-26 NOTE — ED Notes (Signed)
Pt reports nausea and dizziness that started today.  Denies vomiting.  Denies pain.

## 2015-06-26 NOTE — Discharge Instructions (Signed)
Nausea, Adult Laura Salinas, you blood and urine tests were all normal today.  Take reglan as needed for nausea at home and see a primary care doctor within 3 days for close follow up. If symptoms worsen, come back to the ED immediately. Thank you.   Nausea means you feel sick to your stomach or need to throw up (vomit). It may be a sign of a more serious problem. If nausea gets worse, you may throw up. If you throw up a lot, you may lose too much body fluid (dehydration). HOME CARE   Get plenty of rest.  Ask your doctor how to replace body fluid losses (rehydrate).  Eat small amounts of food. Sip liquids more often.  Take all medicines as told by your doctor. GET HELP RIGHT AWAY IF:  You have a fever.  You pass out (faint).  You keep throwing up or have blood in your throw up.  You are very weak, have dry lips or a dry mouth, or you are very thirsty (dehydrated).  You have dark or bloody poop (stool).  You have very bad chest or belly (abdominal) pain.  You do not get better after 2 days, or you get worse.  You have a headache. MAKE SURE YOU:  Understand these instructions.  Will watch your condition.  Will get help right away if you are not doing well or get worse.   This information is not intended to replace advice given to you by your health care provider. Make sure you discuss any questions you have with your health care provider.   Document Released: 02/07/2011 Document Revised: 05/13/2011 Document Reviewed: 02/07/2011 Elsevier Interactive Patient Education 2016 Elsevier Inc. Dizziness Dizziness is a common problem. It makes you feel unsteady or lightheaded. You may feel like you are about to pass out (faint). Dizziness can lead to injury if you stumble or fall. Anyone can get dizzy, but dizziness is more common in older adults. This condition can be caused by a number of things, including:  Medicines.  Dehydration.  Illness. HOME CARE Following these  instructions may help with your condition: Eating and Drinking  Drink enough fluid to keep your pee (urine) clear or pale yellow. This helps to keep you from getting dehydrated. Try to drink more clear fluids, such as water.  Do not drink alcohol.  Limit how much caffeine you drink or eat if told by your doctor.  Limit how much salt you drink or eat if told by your doctor. Activity  Avoid making quick movements.  When you stand up from sitting in a chair, steady yourself until you feel okay.  In the morning, first sit up on the side of the bed. When you feel okay, stand slowly while you hold onto something. Do this until you know that your balance is fine.  Move your legs often if you need to stand in one place for a long time. Tighten and relax your muscles in your legs while you are standing.  Do not drive or use heavy machinery if you feel dizzy.  Avoid bending down if you feel dizzy. Place items in your home so that they are easy for you to reach without leaning over. Lifestyle  Do not use any tobacco products, including cigarettes, chewing tobacco, or electronic cigarettes. If you need help quitting, ask your doctor.  Try to lower your stress level, such as with yoga or meditation. Talk with your doctor if you need help. General Instructions  Watch your  dizziness for any changes.  Take medicines only as told by your doctor. Talk with your doctor if you think that your dizziness is caused by a medicine that you are taking.  Tell a friend or a family member that you are feeling dizzy. If he or she notices any changes in your behavior, have this person call your doctor.  Keep all follow-up visits as told by your doctor. This is important. GET HELP IF:  Your dizziness does not go away.  Your dizziness or light-headedness gets worse.  You feel sick to your stomach (nauseous).  You have trouble hearing.  You have new symptoms.  You are unsteady on your feet or you feel  like the room is spinning. GET HELP RIGHT AWAY IF:  You throw up (vomit) or have diarrhea and are unable to eat or drink anything.  You have trouble:  Talking.  Walking.  Swallowing.  Using your arms, hands, or legs.  You feel generally weak.  You are not thinking clearly or you have trouble forming sentences. It may take a friend or family member to notice this.  You have:  Chest pain.  Pain in your belly (abdomen).  Shortness of breath.  Sweating.  Your vision changes.  You are bleeding.  You have a headache.  You have neck pain or a stiff neck.  You have a fever.   This information is not intended to replace advice given to you by your health care provider. Make sure you discuss any questions you have with your health care provider.   Document Released: 02/07/2011 Document Revised: 07/05/2014 Document Reviewed: 02/14/2014 Elsevier Interactive Patient Education [employer] Elsevier Inc.

## 2015-06-26 NOTE — ED Provider Notes (Signed)
CSN: [id number]     Arrival date & time 06/26/15  0014 History  By signing my name below, I, Budd Palmer, attest that this documentation has been prepared under the direction and in the presence of Tomasita Crumble, MD. Electronically Signed: Budd Palmer, ED Scribe. 06/26/2015. 12:29 AM.     Chief Complaint  Patient presents with  . Nausea   The history is provided by the patient. No language interpreter was used.   HPI Comments: Laura Salinas is a 30 y.o. female who presents to the Emergency Department complaining of nausea onset 1 day ago while driving to work. She reports associated dizziness and constant numbness to both hands, as well as diarrhea. She notes exacerbation of the nausea with moving around. She denies any unusual or suspect food intake. Per nurse, pt reported her LNMP was irregular, and noted she has an Implanon, which is now 30 years old. Pt denies any pains or vomiting, as well as dysuria, hematuria, abnormal vaginal discharge or bleeding, chest pain, and SOB.  Pt is allergic to penicillins.  History reviewed. No pertinent past medical history. History reviewed. No pertinent past surgical history. History reviewed. No pertinent family history. Social History  Substance Use Topics  . Smoking status: Former Games developer  . Smokeless tobacco: None  . Alcohol Use: No   OB History    No data available     Review of Systems A complete 10 system review of systems was obtained and all systems are negative except as noted in the HPI and PMH.   Allergies  Penicillins  Home Medications   Prior to Admission medications   Not on File   BP 132/86 mmHg  Pulse 70  Temp(Src) 97.9 F (36.6 C) (Oral)  Resp 18  Ht  (1.448 m)  Wt 160 lb (72.576 kg)  BMI 34.61 kg/m2  SpO2 100%  LMP 06/23/2015 Physical Exam  Constitutional: She is oriented to person, place, and time. She appears well-developed and well-nourished. No distress.  HENT:  Head: Normocephalic and atraumatic.   Nose: Nose normal.  Mouth/Throat: Oropharynx is clear and moist. No oropharyngeal exudate.  Eyes: Conjunctivae and EOM are normal. Pupils are equal, round, and reactive to light. No scleral icterus.  Neck: Normal range of motion. Neck supple. No JVD present. No tracheal deviation present. No thyromegaly present.  Cardiovascular: Normal rate, regular rhythm and normal heart sounds.  Exam reveals no gallop and no friction rub.   No murmur heard. Pulmonary/Chest: Effort normal and breath sounds normal. No respiratory distress. She has no wheezes. She exhibits no tenderness.  Abdominal: Soft. Bowel sounds are normal. She exhibits no distension and no mass. There is tenderness (LUQ). There is no rebound and no guarding.  Musculoskeletal: Normal range of motion. She exhibits no edema or tenderness.  Lymphadenopathy:    She has no cervical adenopathy.  Neurological: She is alert and oriented to person, place, and time. No cranial nerve deficit. She exhibits normal muscle tone.  Skin: Skin is warm and dry. No rash noted. No erythema. No pallor.  Nursing note and vitals reviewed.   ED Course  Procedures  DIAGNOSTIC STUDIES: Oxygen Saturation is 100% on RA, normal by my interpretation.    COORDINATION OF CARE: 12:27 AM - Discussed plans to order diagnostic studies. Pt advised of plan for treatment and pt agrees.  Labs Review Labs Reviewed  COMPREHENSIVE METABOLIC PANEL - Abnormal; Notable for the following:    CO2 21 (*)    BUN 26 (*)  Calcium 8.1 (*)    ALT 13 (*)    All other components within normal limits  CBC WITH DIFFERENTIAL/PLATELET  LIPASE, BLOOD  URINALYSIS, ROUTINE W REFLEX MICROSCOPIC (NOT AT [facility])  PREGNANCY, URINE    Imaging Review No results found. I have personally reviewed and evaluated these images and lab results as part of my medical decision-making.   EKG Interpretation None      MDM   Final diagnoses:  None   Patient presents to the ED for nausea  and dizziness of sudden onset beginning today while driving. She states her implanon is 30 yrs old and she has unprotected sex.  Also had an atypical period this month.  She denies pain anywhere and PE is normal.  Will obtain labs, EKG, pregnancy test for evaluation.  She was given reglan for her symptoms.  2:51 AM Upon repeat evaluation, patient states her symptoms have much improved.  Labs are unremarkable, EKG does not show a cause for her dizziness, and urine is negative.  She was advised to see a PCP within 3 days for further evaluation.  She appears well and in NAD.  VS remain within her normal limits and she is safe for DC.    I personally performed the services described in this documentation, which was scribed in my presence. The recorded information has been reviewed and is accurate.     Tomasita CrumbleAdeleke Tyasia Packard, MD 06/26/15 [phone]

## 2016-01-30 ENCOUNTER — Emergency Department (HOSPITAL_BASED_OUTPATIENT_CLINIC_OR_DEPARTMENT_OTHER)
Admission: EM | Admit: 2016-01-30 | Discharge: 2016-01-31 | Disposition: A | Discharge status: Home / Self Care | Payer: Medicaid Other | Attending: Emergency Medicine | Admitting: Emergency Medicine

## 2016-01-30 ENCOUNTER — Emergency Department (HOSPITAL_BASED_OUTPATIENT_CLINIC_OR_DEPARTMENT_OTHER): Payer: Medicaid Other

## 2016-01-30 ENCOUNTER — Encounter (HOSPITAL_BASED_OUTPATIENT_CLINIC_OR_DEPARTMENT_OTHER): Payer: Self-pay | Admitting: *Deleted

## 2016-01-30 DIAGNOSIS — I880 Nonspecific mesenteric lymphadenitis: Secondary | ICD-10-CM | POA: Diagnosis not present

## 2016-01-30 DIAGNOSIS — R109 Unspecified abdominal pain: Secondary | ICD-10-CM

## 2016-01-30 DIAGNOSIS — Z87891 Personal history of nicotine dependence: Secondary | ICD-10-CM | POA: Diagnosis not present

## 2016-01-30 DIAGNOSIS — R1084 Generalized abdominal pain: Secondary | ICD-10-CM | POA: Diagnosis present

## 2016-01-30 LAB — URINE MICROSCOPIC-ADD ON

## 2016-01-30 LAB — CBC
HCT: 37.1 % (ref 36.0–46.0)
Hemoglobin: 12 g/dL (ref 12.0–15.0)
MCH: 28.6 pg (ref 26.0–34.0)
MCHC: 32.3 g/dL (ref 30.0–36.0)
MCV: 88.3 fL (ref 78.0–100.0)
PLATELETS: 268 10*3/uL (ref 150–400)
RBC: 4.2 MIL/uL (ref 3.87–5.11)
RDW: 13.3 % (ref 11.5–15.5)
WBC: 9.3 10*3/uL (ref 4.0–10.5)

## 2016-01-30 LAB — URINALYSIS, ROUTINE W REFLEX MICROSCOPIC
Bilirubin Urine: NEGATIVE
Glucose, UA: NEGATIVE mg/dL
Hgb urine dipstick: NEGATIVE
Ketones, ur: NEGATIVE mg/dL
NITRITE: NEGATIVE
PH: 8 (ref 5.0–8.0)
Protein, ur: NEGATIVE mg/dL
SPECIFIC GRAVITY, URINE: 1.025 (ref 1.005–1.030)

## 2016-01-30 LAB — PREGNANCY, URINE: Preg Test, Ur: NEGATIVE

## 2016-01-30 MED ORDER — ONDANSETRON 4 MG PO TBDP
4.0000 mg | ORAL_TABLET | Freq: Once | ORAL | Status: AC | PRN
  Administered 2016-01-30: 4 mg via ORAL
  Filled 2016-01-30: qty 1

## 2016-01-30 NOTE — ED Triage Notes (Addendum)
Pt c/o diffuse abd pain x 1 week n/v only no period x 3 months

## 2016-01-30 NOTE — ED Provider Notes (Signed)
[facility] Provider Note   CSN: [id number] Arrival date & time: 01/30/16  2244 By signing my name below, I, Bridgette HabermannMaria Tan, attest that this documentation has been prepared under the direction and in the presence of Geoffery Lyonsouglas Bubber Rothert, MD. Electronically Signed: Bridgette HabermannMaria Tan, ED Scribe. 01/30/16. 11:45 PM.  History   Chief Complaint Chief Complaint  Patient presents with  . Abdominal Pain   HPI Comments: Laura Salinas is a 30 y.o. female with no pertinent PMHx, who presents to the Emergency Department complaining of 10/10 abdominal pain onset one week ago with associated nausea and vomiting. Pt reports normal appetite and normal bowel movement. She notes that she has not had her period for three months. Pain is exacerbated with sitting still. She has not tried any OTC medications PTA. Pt is not on birth control at this time; she took a pregnancy test at home and states it was negative. No known sick contacts with similar symptoms. Pt denies dysuria, diarrhea, constipation, fever, chills, or any other associated symptoms.  The history is provided by the patient. No language interpreter was used.  Abdominal Pain   This is a new problem. The current episode started more than 2 days ago. The problem occurs constantly. The problem has not changed since onset.The pain is located in the generalized abdominal region. The pain is at a severity of 10/10. The pain is moderate. Nothing relieves the symptoms.    History reviewed. No pertinent past medical history.  There are no active problems to display for this patient.   History reviewed. No pertinent surgical history.  OB History    No data available       Home Medications    Prior to Admission medications   Not on File    Family History No family history on file.  Social History Social History  Substance Use Topics  . Smoking status: Former Games developermoker  . Smokeless tobacco: Not on file  . Alcohol use No     Allergies     Penicillins   Review of Systems Review of Systems 10 Systems reviewed and all are negative for acute change except as noted in the HPI. Physical Exam Updated Vital Signs BP 139/94   Pulse 75   Temp 97.7 F (36.5 C)   Resp 16   Ht 4\' 9"  (1.448 m)   Wt 180 lb (81.6 kg)   LMP 10/30/2015   SpO2 100%   BMI 38.95 kg/m   Physical Exam  Constitutional: She is oriented to person, place, and time. She appears well-developed and well-nourished. No distress.  HENT:  Head: Normocephalic and atraumatic.  Mouth/Throat: Oropharynx is clear and moist. No oropharyngeal exudate.  Eyes: Conjunctivae and EOM are normal. Pupils are equal, round, and reactive to light.  Neck: Normal range of motion. Neck supple.  No meningismus.  Cardiovascular: Normal rate, regular rhythm, normal heart sounds and intact distal pulses.   No murmur heard. Pulmonary/Chest: Effort normal and breath sounds normal. No respiratory distress.  Abdominal: Soft. She exhibits no distension. There is tenderness. There is no rebound and no guarding.  Tenderness to palpation in all four quadrants, most notably the RLQ.  Musculoskeletal: Normal range of motion. She exhibits no edema or tenderness.  Neurological: She is alert and oriented to person, place, and time. No cranial nerve deficit. She exhibits normal muscle tone. Coordination normal.   5/5 strength throughout. CN 2-12 intact.Equal grip strength.   Skin: Skin is warm.  Psychiatric: She has a normal mood  and affect. Her behavior is normal.  Nursing note and vitals reviewed.    ED Treatments / Results  DIAGNOSTIC STUDIES: Oxygen Saturation is 100% on RA, normal by my interpretation.    COORDINATION OF CARE: 11:43 PM Discussed treatment plan with pt at bedside which includes abdomen CT and pt agreed to plan.  Labs (all labs ordered are listed, but only abnormal results are displayed) Labs Reviewed  URINALYSIS, ROUTINE W REFLEX MICROSCOPIC (NOT AT [facility]) -  Abnormal; Notable for the following:       Result Value   Leukocytes, UA TRACE (*)    All other components within normal limits  URINE MICROSCOPIC-ADD ON - Abnormal; Notable for the following:    Squamous Epithelial / LPF 0-5 (*)    Bacteria, UA RARE (*)    All other components within normal limits  PREGNANCY, URINE  LIPASE, BLOOD  COMPREHENSIVE METABOLIC PANEL  CBC    EKG  EKG Interpretation None       Radiology No results found.  Procedures Procedures (including critical care time)  Medications Ordered in ED Medications  ondansetron (ZOFRAN-ODT) disintegrating tablet 4 mg (4 mg Oral Given 01/30/16 2339)     Initial Impression / Assessment and Plan / ED Course  I have reviewed the triage vital signs and the nursing notes.  Pertinent labs & imaging results that were available during my care of the patient were reviewed by me and considered in my medical decision making (see chart for details).  Clinical Course     CT scan suggestive of mesenteric adenitis, but otherwise shows no acute process. She appears comfortable and abdominal exam remains benign. Patient reports not having a period for the past several months, however she is not pregnant today. I've advised her to take this up with her gynecologist for further workup if this persists.  She understands to return to the ER if symptoms worsen or change. She will be treated with pain medication.  Final Clinical Impressions(s) / ED Diagnoses   Final diagnoses:  None    New Prescriptions New Prescriptions   No medications on file   I personally performed the services described in this documentation, which was scribed in my presence. The recorded information has been reviewed and is accurate.        Geoffery Lyonsouglas Biran Mayberry, MD 01/31/16 [phone]

## 2016-01-30 NOTE — ED Notes (Signed)
ED Provider at bedside. 

## 2016-01-31 LAB — COMPREHENSIVE METABOLIC PANEL
ALBUMIN: 3.6 g/dL (ref 3.5–5.0)
ALT: 11 U/L — ABNORMAL LOW (ref 14–54)
AST: 18 U/L (ref 15–41)
Alkaline Phosphatase: 77 U/L (ref 38–126)
Anion gap: 4 — ABNORMAL LOW (ref 5–15)
BILIRUBIN TOTAL: 0.2 mg/dL — AB (ref 0.3–1.2)
BUN: 20 mg/dL (ref 6–20)
CHLORIDE: 109 mmol/L (ref 101–111)
CO2: 26 mmol/L (ref 22–32)
Calcium: 8.5 mg/dL — ABNORMAL LOW (ref 8.9–10.3)
Creatinine, Ser: 0.94 mg/dL (ref 0.44–1.00)
GFR calc Af Amer: >60 mL/min (ref >60)
GFR calc non Af Amer: >60 mL/min (ref >60)
GLUCOSE: 96 mg/dL (ref 65–99)
POTASSIUM: 4.5 mmol/L (ref 3.5–5.1)
SODIUM: 139 mmol/L (ref 135–145)
Total Protein: 7.3 g/dL (ref 6.5–8.1)

## 2016-01-31 LAB — LIPASE, BLOOD: LIPASE: 28 U/L (ref 11–51)

## 2016-01-31 MED ORDER — IOPAMIDOL (ISOVUE-300) INJECTION 61%
100.0000 mL | Freq: Once | INTRAVENOUS | Status: AC | PRN
  Administered 2016-01-31: 100 mL via INTRAVENOUS

## 2016-01-31 MED ORDER — HYDROCODONE-ACETAMINOPHEN 5-325 MG PO TABS: 1.0000 | ORAL_TABLET | Freq: Four times a day (QID) | ORAL | 0 refills | Status: AC | PRN

## 2016-01-31 NOTE — ED Notes (Signed)
Patient transported to CT 

## 2016-01-31 NOTE — Discharge Instructions (Signed)
Hydrocodone as prescribed as needed for pain.  Return to the emergency department if you develop worsening pain, high fevers, bloody stools, or other new and concerning symptoms.  You should also follow-up with your gynecologist in the next 1-2 weeks.

## 2016-05-29 IMAGING — CR DG CHEST 2V
1 series · 2 of 2 positions shown · non-contrast
Comparison: February 24, 2011

CLINICAL DATA: One day history of cough and chest pain

EXAM:
CHEST  2 VIEW

[Series 1: dg chest 2 view · 0.14mm/px · 2 of 2 slices shown]
[im 1/2]
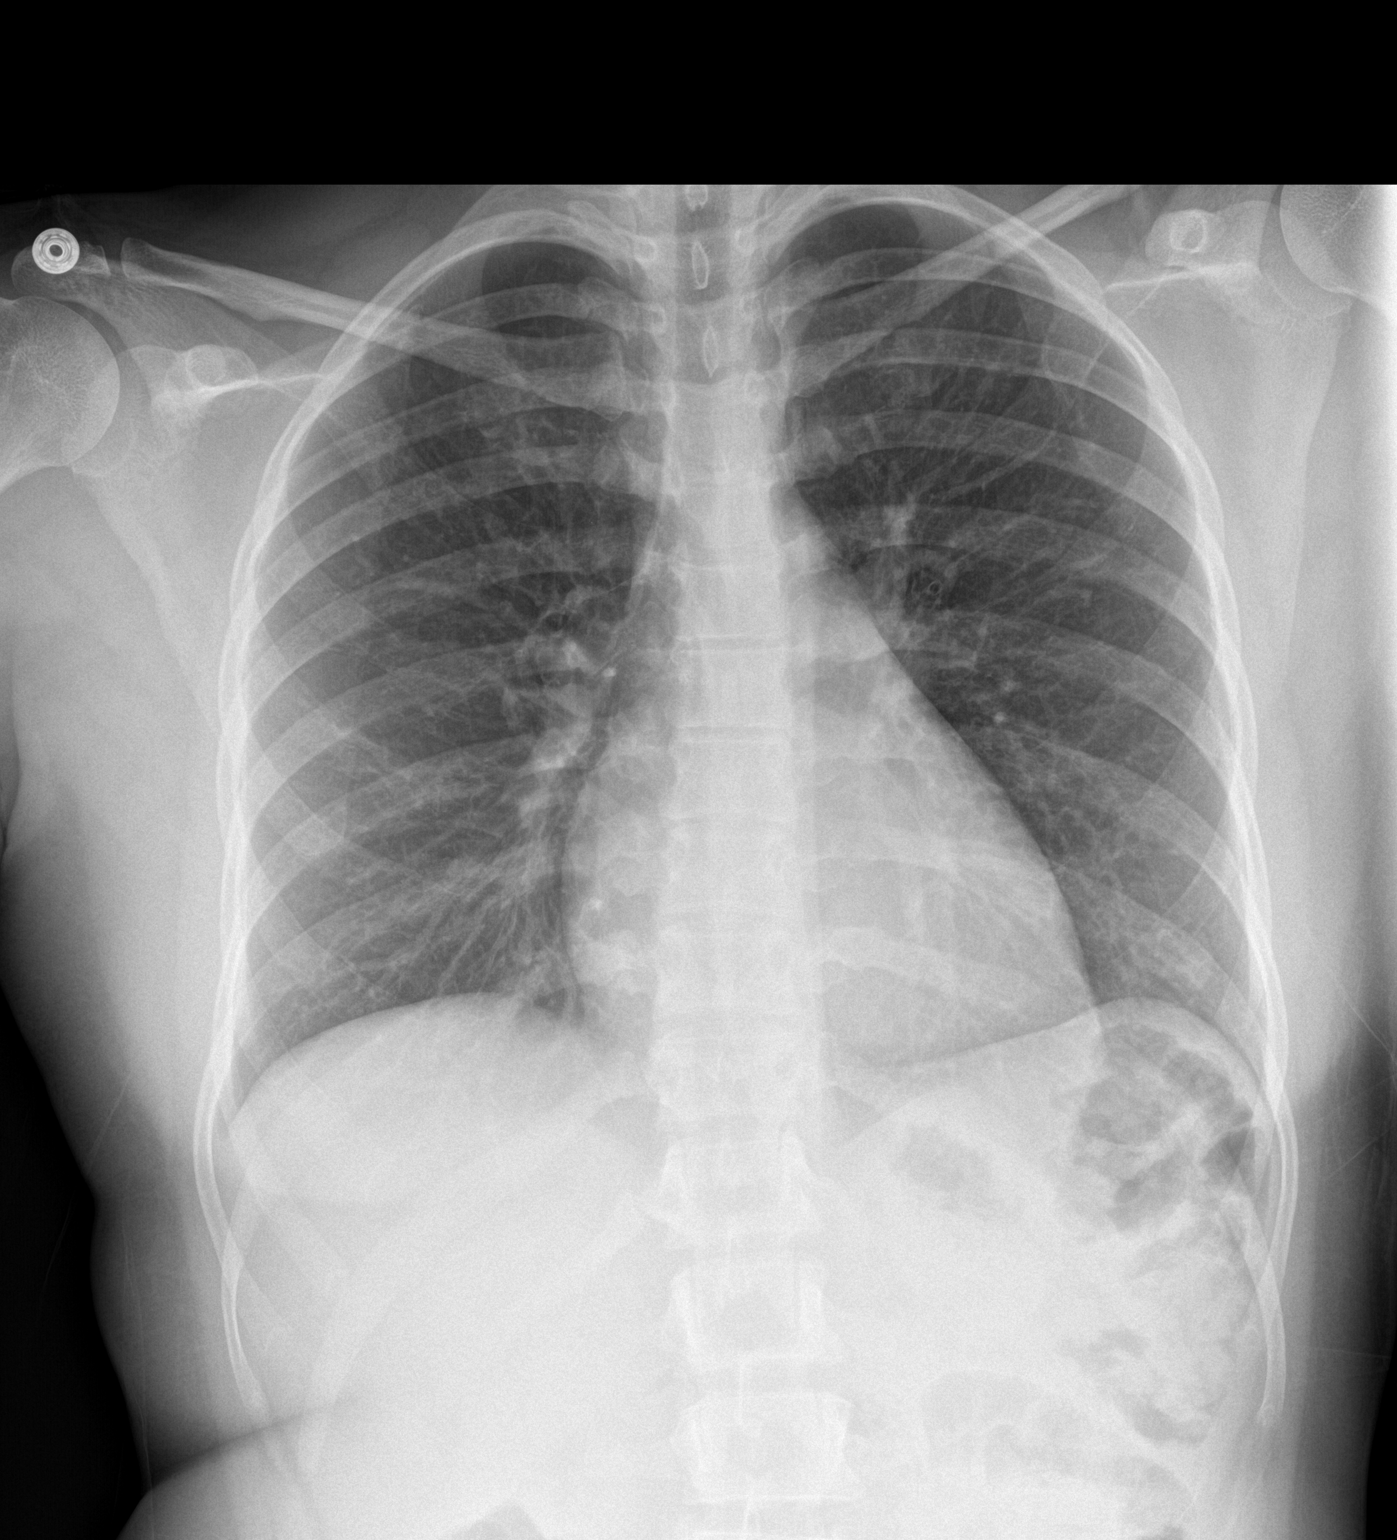
[im 2/2]
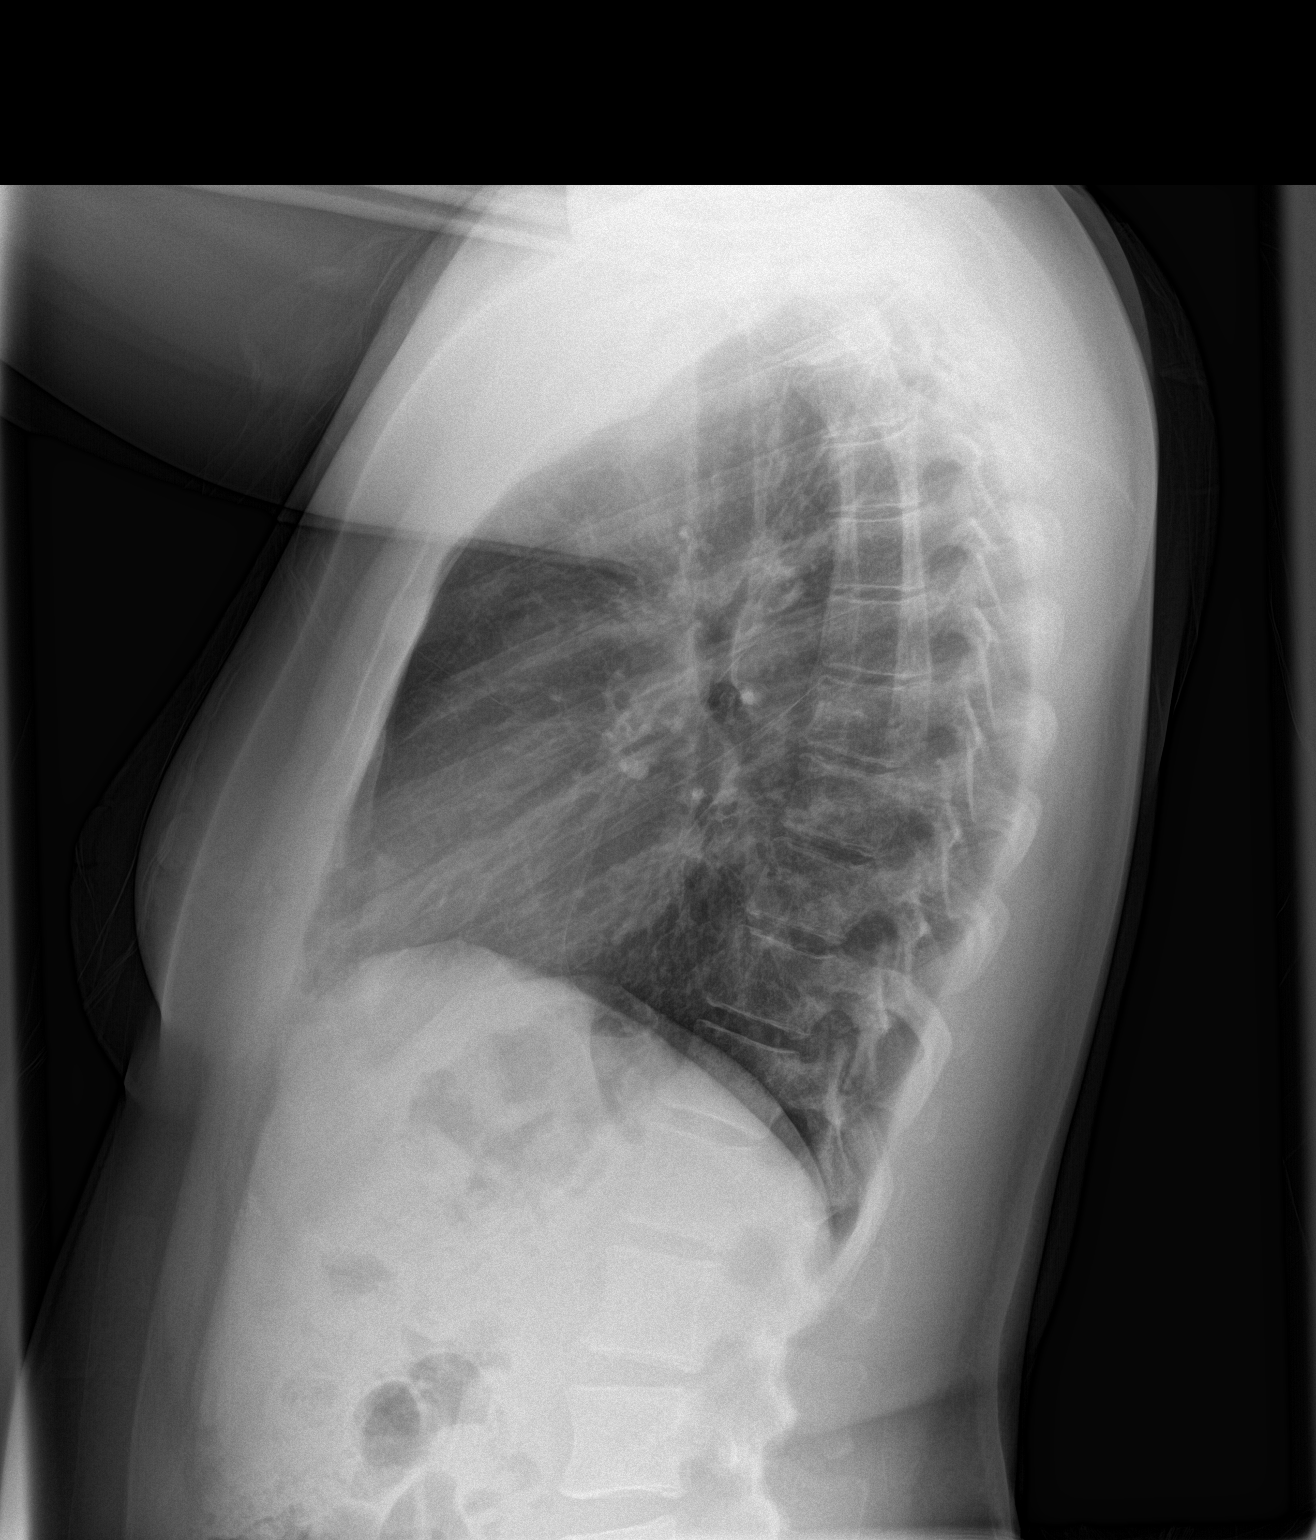

[2 of 2 positions shown; findings below may reference images not displayed]

FINDINGS: Lungs are clear. Heart size and pulmonary vascularity are normal. No
pneumothorax. No adenopathy. No bone lesions.
IMPRESSION: No abnormality noted.

## 2018-11-29 ENCOUNTER — Other Ambulatory Visit: Payer: Self-pay

## 2018-11-29 ENCOUNTER — Encounter (HOSPITAL_BASED_OUTPATIENT_CLINIC_OR_DEPARTMENT_OTHER): Payer: Self-pay | Admitting: Emergency Medicine

## 2018-11-29 ENCOUNTER — Emergency Department (HOSPITAL_BASED_OUTPATIENT_CLINIC_OR_DEPARTMENT_OTHER): Payer: Medicaid Other

## 2018-11-29 ENCOUNTER — Emergency Department (HOSPITAL_BASED_OUTPATIENT_CLINIC_OR_DEPARTMENT_OTHER)
Admission: EM | Admit: 2018-11-29 | Discharge: 2018-11-29 | Disposition: A | Discharge status: Home / Self Care | Payer: Medicaid Other | Attending: Emergency Medicine | Admitting: Emergency Medicine

## 2018-11-29 DIAGNOSIS — R1013 Epigastric pain: Secondary | ICD-10-CM | POA: Insufficient documentation

## 2018-11-29 DIAGNOSIS — R110 Nausea: Secondary | ICD-10-CM | POA: Insufficient documentation

## 2018-11-29 DIAGNOSIS — Z87891 Personal history of nicotine dependence: Secondary | ICD-10-CM | POA: Diagnosis not present

## 2018-11-29 DIAGNOSIS — R1011 Right upper quadrant pain: Secondary | ICD-10-CM | POA: Diagnosis not present

## 2018-11-29 LAB — URINALYSIS, ROUTINE W REFLEX MICROSCOPIC
Bilirubin Urine: NEGATIVE
Glucose, UA: NEGATIVE mg/dL
Hgb urine dipstick: NEGATIVE
Ketones, ur: NEGATIVE mg/dL
Leukocytes,Ua: NEGATIVE
Nitrite: NEGATIVE
Protein, ur: NEGATIVE mg/dL
Specific Gravity, Urine: 1.02 (ref 1.005–1.030)
pH: 7 (ref 5.0–8.0)

## 2018-11-29 LAB — CBC
HCT: 39.7 % (ref 36.0–46.0)
Hemoglobin: 12.5 g/dL (ref 12.0–15.0)
MCH: 28.7 pg (ref 26.0–34.0)
MCHC: 31.5 g/dL (ref 30.0–36.0)
MCV: 91.1 fL (ref 80.0–100.0)
Platelets: 314 10*3/uL (ref 150–400)
RBC: 4.36 MIL/uL (ref 3.87–5.11)
RDW: 12.4 % (ref 11.5–15.5)
WBC: 6.4 10*3/uL (ref 4.0–10.5)
nRBC: 0 % (ref 0.0–0.2)

## 2018-11-29 LAB — COMPREHENSIVE METABOLIC PANEL
ALT: 12 U/L (ref 0–44)
AST: 17 U/L (ref 15–41)
Albumin: 3.7 g/dL (ref 3.5–5.0)
Alkaline Phosphatase: 86 U/L (ref 38–126)
Anion gap: 7 (ref 5–15)
BUN: 15 mg/dL (ref 6–20)
CO2: 23 mmol/L (ref 22–32)
Calcium: 8.5 mg/dL — ABNORMAL LOW (ref 8.9–10.3)
Chloride: 108 mmol/L (ref 98–111)
Creatinine, Ser: 0.66 mg/dL (ref 0.44–1.00)
GFR calc Af Amer: >60 mL/min (ref >60)
GFR calc non Af Amer: >60 mL/min (ref >60)
Glucose, Bld: 92 mg/dL (ref 70–99)
Potassium: 4.1 mmol/L (ref 3.5–5.1)
Sodium: 138 mmol/L (ref 135–145)
Total Bilirubin: 0.4 mg/dL (ref 0.3–1.2)
Total Protein: 7.4 g/dL (ref 6.5–8.1)

## 2018-11-29 LAB — LIPASE, BLOOD: Lipase: 29 U/L (ref 11–51)

## 2018-11-29 LAB — PREGNANCY, URINE: Preg Test, Ur: NEGATIVE

## 2018-11-29 MED ORDER — SODIUM CHLORIDE 0.9% FLUSH
3.0000 mL | Freq: Once | INTRAVENOUS | Status: DC
  Filled 2018-11-29: qty 3

## 2018-11-29 NOTE — ED Triage Notes (Signed)
Generalized abd pain since yesterday. Denies N/v/D, urinary symptoms.

## 2018-11-29 NOTE — Discharge Instructions (Addendum)
Return to the emergency department if you develop high fever, worsening pain, bloody stool or vomit, or other new and concerning symptoms.

## 2018-11-29 NOTE — ED Notes (Signed)
ED Provider at bedside. 

## 2018-11-29 NOTE — ED Provider Notes (Signed)
[facility] EMERGENCY DEPARTMENT Provider Note   CSN: [id number] Arrival date & time: 11/29/18  0913     History   Chief Complaint Chief Complaint  Patient presents with  . Abdominal Pain    HPI Laura Salinas is a 33 y.o. female.     Patient is a 33 year old female with no significant past medical history.  She presents today for evaluation of abdominal pain.  This began yesterday and is worsening.  She describes generalized discomfort that is most notable in the epigastric region.  It is worse when she eats.  Pain is constant, but seems to be worse with eating.  She denies fevers or chills.  She denies nausea or vomiting.  She denies constipation, diarrhea, or black or bloody stools.  Last bowel movement was yesterday and normal.  She denies any urinary or vaginal symptoms.  Last menstrual period was 2 weeks ago and normal.  She denies the possibility of pregnancy.  The history is provided by the patient.  Abdominal Pain Pain location:  Epigastric Pain quality: cramping   Pain radiates to:  Does not radiate Pain severity:  Moderate Timing:  Constant Progression:  Worsening Chronicity:  New Relieved by:  Nothing Worsened by:  Nothing Ineffective treatments:  None tried Associated symptoms: nausea   Associated symptoms: no diarrhea, no fever, no vaginal bleeding, no vaginal discharge and no vomiting     History reviewed. No pertinent past medical history.  There are no active problems to display for this patient.   History reviewed. No pertinent surgical history.   OB History   No obstetric history on file.      Home Medications    Prior to Admission medications   Medication Sig Start Date End Date Taking? Authorizing Provider  HYDROcodone-acetaminophen (NORCO) 5-325 MG tablet Take 1-2 tablets by mouth every 6 (six) hours as needed. 01/31/16   Geoffery Lyons, MD    Family History No family history on file.  Social History Social History    Tobacco Use  . Smoking status: Former Games developer  . Smokeless tobacco: Never Used  Substance Use Topics  . Alcohol use: Yes  . Drug use: No     Allergies   Penicillins   Review of Systems Review of Systems  Constitutional: Negative for fever.  Gastrointestinal: Positive for abdominal pain and nausea. Negative for diarrhea and vomiting.  Genitourinary: Negative for vaginal bleeding and vaginal discharge.  All other systems reviewed and are negative.    Physical Exam Updated Vital Signs BP 126/71 (BP Location: Left Arm)   Pulse 64   Temp 99.3 F (37.4 C) (Oral)   Resp 16   Ht 4\' 11"  (1.499 m)   Wt 86.6 kg   LMP 11/11/2018   SpO2 100%   BMI 38.58 kg/m   Physical Exam Vitals signs and nursing note reviewed.  Constitutional:      General: She is not in acute distress.    Appearance: She is well-developed. She is not diaphoretic.  HENT:     Head: Normocephalic and atraumatic.  Neck:     Musculoskeletal: Normal range of motion and neck supple.  Cardiovascular:     Rate and Rhythm: Normal rate and regular rhythm.     Heart sounds: No murmur. No friction rub. No gallop.   Pulmonary:     Effort: Pulmonary effort is normal. No respiratory distress.     Breath sounds: Normal breath sounds. No wheezing.  Abdominal:     General: Bowel sounds  are normal. There is no distension.     Palpations: Abdomen is soft.     Tenderness: There is abdominal tenderness in the right upper quadrant and epigastric area. There is no guarding or rebound.  Musculoskeletal: Normal range of motion.  Skin:    General: Skin is warm and dry.  Neurological:     Mental Status: She is alert and oriented to person, place, and time.      ED Treatments / Results  Labs (all labs ordered are listed, but only abnormal results are displayed) Labs Reviewed  LIPASE, BLOOD  COMPREHENSIVE METABOLIC PANEL  CBC  URINALYSIS, ROUTINE W REFLEX MICROSCOPIC  PREGNANCY, URINE    EKG None  Radiology No  results found.  Procedures Procedures (including critical care time)  Medications Ordered in ED Medications  sodium chloride flush (NS) 0.9 % injection 3 mL (3 mLs Intravenous Not Given 11/29/18 0109)     Initial Impression / Assessment and Plan / ED Course  I have reviewed the triage vital signs and the nursing notes.  Pertinent labs & imaging results that were available during my care of the patient were reviewed by me and considered in my medical decision making (see chart for details).  Patient presenting here with complaints of epigastric discomfort, the etiology of which I am uncertain.  She is afebrile with normal vital signs, no white count, normal LFTs and lipase, and clear urine.  Ultrasound shows no evidence for gallstones or gallbladder issues.  Patient reassessed and is continuing with discomfort.  I have recommended a CT scan to further evaluate, however she tells me that she has to get home to her kids.  I am comfortable with her going home, however patient was counseled to return if she develops worsening pain, high fever, bloody stools, or other new and concerning symptoms.  Final Clinical Impressions(s) / ED Diagnoses   Final diagnoses:  RUQ pain    ED Discharge Orders    None       Veryl Speak, MD 11/29/18 1151

## 2019-04-24 ENCOUNTER — Emergency Department (HOSPITAL_BASED_OUTPATIENT_CLINIC_OR_DEPARTMENT_OTHER)
Admission: EM | Admit: 2019-04-24 | Discharge: 2019-04-24 | Disposition: A | Discharge status: Home / Self Care | Payer: Medicaid Other | Attending: Emergency Medicine | Admitting: Emergency Medicine

## 2019-04-24 ENCOUNTER — Other Ambulatory Visit: Payer: Self-pay

## 2019-04-24 ENCOUNTER — Encounter (HOSPITAL_BASED_OUTPATIENT_CLINIC_OR_DEPARTMENT_OTHER): Payer: Self-pay

## 2019-04-24 DIAGNOSIS — Z3A01 Less than 8 weeks gestation of pregnancy: Secondary | ICD-10-CM | POA: Diagnosis not present

## 2019-04-24 DIAGNOSIS — Z880 Allergy status to penicillin: Secondary | ICD-10-CM | POA: Insufficient documentation

## 2019-04-24 DIAGNOSIS — O200 Threatened abortion: Secondary | ICD-10-CM | POA: Diagnosis not present

## 2019-04-24 DIAGNOSIS — O209 Hemorrhage in early pregnancy, unspecified: Secondary | ICD-10-CM | POA: Diagnosis present

## 2019-04-24 DIAGNOSIS — O4690 Antepartum hemorrhage, unspecified, unspecified trimester: Secondary | ICD-10-CM

## 2019-04-24 LAB — URINALYSIS, MICROSCOPIC (REFLEX): RBC / HPF: >50 RBC/hpf (ref 0–5)

## 2019-04-24 LAB — BASIC METABOLIC PANEL
Anion gap: 8 (ref 5–15)
BUN: 21 mg/dL — ABNORMAL HIGH (ref 6–20)
CO2: 25 mmol/L (ref 22–32)
Calcium: 8.8 mg/dL — ABNORMAL LOW (ref 8.9–10.3)
Chloride: 104 mmol/L (ref 98–111)
Creatinine, Ser: 0.76 mg/dL (ref 0.44–1.00)
GFR calc Af Amer: >60 mL/min (ref >60)
GFR calc non Af Amer: >60 mL/min (ref >60)
Glucose, Bld: 113 mg/dL — ABNORMAL HIGH (ref 70–99)
Potassium: 4 mmol/L (ref 3.5–5.1)
Sodium: 137 mmol/L (ref 135–145)

## 2019-04-24 LAB — CBC WITH DIFFERENTIAL/PLATELET
Abs Immature Granulocytes: 0.01 10*3/uL (ref 0.00–0.07)
Basophils Absolute: 0 10*3/uL (ref 0.0–0.1)
Basophils Relative: 0 %
Eosinophils Absolute: 0.2 10*3/uL (ref 0.0–0.5)
Eosinophils Relative: 3 %
HCT: 37 % (ref 36.0–46.0)
Hemoglobin: 11.7 g/dL — ABNORMAL LOW (ref 12.0–15.0)
Immature Granulocytes: 0 %
Lymphocytes Relative: 32 %
Lymphs Abs: 2.5 10*3/uL (ref 0.7–4.0)
MCH: 28.3 pg (ref 26.0–34.0)
MCHC: 31.6 g/dL (ref 30.0–36.0)
MCV: 89.4 fL (ref 80.0–100.0)
Monocytes Absolute: 0.5 10*3/uL (ref 0.1–1.0)
Monocytes Relative: 6 %
Neutro Abs: 4.6 10*3/uL (ref 1.7–7.7)
Neutrophils Relative %: 59 %
Platelets: 286 10*3/uL (ref 150–400)
RBC: 4.14 MIL/uL (ref 3.87–5.11)
RDW: 12.7 % (ref 11.5–15.5)
WBC: 7.9 10*3/uL (ref 4.0–10.5)
nRBC: 0 % (ref 0.0–0.2)

## 2019-04-24 LAB — URINALYSIS, ROUTINE W REFLEX MICROSCOPIC
Bilirubin Urine: NEGATIVE
Glucose, UA: NEGATIVE mg/dL
Ketones, ur: NEGATIVE mg/dL
Leukocytes,Ua: NEGATIVE
Nitrite: NEGATIVE
Protein, ur: >300 mg/dL — AB
Specific Gravity, Urine: >1.03 — ABNORMAL HIGH (ref 1.005–1.030)
pH: 6 (ref 5.0–8.0)

## 2019-04-24 LAB — HCG, QUANTITATIVE, PREGNANCY: hCG, Beta Chain, Quant, S: 4223 m[IU]/mL — ABNORMAL HIGH (ref <5)

## 2019-04-24 NOTE — ED Provider Notes (Signed)
[facility] EMERGENCY DEPARTMENT Provider Note   CSN: [id number] Arrival date & time: 04/24/19  0054     History Chief Complaint  Patient presents with  . Vaginal Bleeding    Laura Salinas is a 34 y.o. female.  The history is provided by the patient.  Vaginal Bleeding Quality:  Bright red and clots Severity:  Moderate Onset quality:  Gradual Duration:  1 week Timing:  Constant Progression:  Waxing and waning (much worse today.  ) Menstrual history:  Irregular and missed period Number of pads used:  Cannot say  Possible pregnancy: patient is pregnant    Context: spontaneously   Relieved by:  Nothing Worsened by:  Nothing Ineffective treatments:  None tried Associated symptoms: no abdominal pain, no back pain, no dizziness and no fever   Risk factors: no bleeding disorder and no STD   Patient is G8P7 at 6 weeks by LMP and [place].  Has been bleeding for over a week. Told she may be miscarrying.      History reviewed. No pertinent past medical history.  There are no problems to display for this patient.   History reviewed. No pertinent surgical history.   OB History    Gravida  1   Para      Term      Preterm      AB      Living        SAB      TAB      Ectopic      Multiple      Live Births              History reviewed. No pertinent family history.  Social History   Tobacco Use  . Smoking status: Former Research scientist (life sciences)  . Smokeless tobacco: Never Used  Substance Use Topics  . Alcohol use: Yes  . Drug use: No    Home Medications Prior to Admission medications   Medication Sig Start Date End Date Taking? Authorizing Provider  HYDROcodone-acetaminophen (NORCO) 5-325 MG tablet Take 1-2 tablets by mouth every 6 (six) hours as needed. 01/31/16   Veryl Speak, MD    Allergies    Penicillins  Review of Systems   Review of Systems  Constitutional: Negative for fever.  HENT: Negative for congestion.   Eyes: Negative for visual  disturbance.  Respiratory: Negative for shortness of breath.   Cardiovascular: Negative for chest pain.  Gastrointestinal: Negative for abdominal pain.  Genitourinary: Positive for vaginal bleeding.  Musculoskeletal: Negative for back pain.  Neurological: Negative for dizziness.  Psychiatric/Behavioral: Negative for agitation.    Physical Exam Updated Vital Signs BP (!) 102/59 (BP Location: Right Arm)   Pulse 63   Temp 97.8 F (36.6 C) (Oral)   Resp 18   Ht 4\' 11"  (1.499 m)   Wt 89.4 kg   LMP 11/11/2018   SpO2 100%   BMI 39.79 kg/m   Physical Exam Vitals and nursing note reviewed.  Constitutional:      General: She is not in acute distress.    Appearance: Normal appearance.  HENT:     Head: Normocephalic and atraumatic.     Nose: Nose normal.  Eyes:     Conjunctiva/sclera: Conjunctivae normal.     Pupils: Pupils are equal, round, and reactive to light.  Cardiovascular:     Rate and Rhythm: Normal rate and regular rhythm.     Pulses: Normal pulses.     Heart sounds: Normal heart sounds.  Pulmonary:  Effort: Pulmonary effort is normal.     Breath sounds: Normal breath sounds.  Abdominal:     General: Abdomen is flat. Bowel sounds are normal.     Tenderness: There is no abdominal tenderness. There is no guarding or rebound.  Genitourinary:    Comments: Chaperone present os closed no CMT, scant bleeding Musculoskeletal:        General: Normal range of motion.     Cervical back: Normal range of motion and neck supple.  Skin:    General: Skin is warm and dry.     Capillary Refill: Capillary refill takes less than 2 seconds.  Neurological:     General: No focal deficit present.     Mental Status: She is alert and oriented to person, place, and time.     Deep Tendon Reflexes: Reflexes normal.  Psychiatric:        Mood and Affect: Mood normal.        Behavior: Behavior normal.     ED Results / Procedures / Treatments   Labs (all labs ordered are listed, but  only abnormal results are displayed) Results for orders placed or performed during the hospital encounter of 04/24/19  Urinalysis, Routine w reflex microscopic  Result Value Ref Range   Color, Urine RED (A) YELLOW   APPearance CLOUDY (A) CLEAR   Specific Gravity, Urine >1.030 (H) 1.005 - 1.030   pH 6.0 5.0 - 8.0   Glucose, UA NEGATIVE NEGATIVE mg/dL   Hgb urine dipstick LARGE (A) NEGATIVE   Bilirubin Urine NEGATIVE NEGATIVE   Ketones, ur NEGATIVE NEGATIVE mg/dL   Protein, ur >947 (A) NEGATIVE mg/dL   Nitrite NEGATIVE NEGATIVE   Leukocytes,Ua NEGATIVE NEGATIVE  hCG, quantitative, pregnancy  Result Value Ref Range   hCG, Beta Chain, Quant, S 4,223 (H) <5 mIU/mL  CBC with Differential/Platelet  Result Value Ref Range   WBC 7.9 4.0 - 10.5 K/uL   RBC 4.14 3.87 - 5.11 MIL/uL   Hemoglobin 11.7 (L) 12.0 - 15.0 g/dL   HCT 65.4 65.0 - 35.4 %   MCV 89.4 80.0 - 100.0 fL   MCH 28.3 26.0 - 34.0 pg   MCHC 31.6 30.0 - 36.0 g/dL   RDW 65.6 81.2 - 75.1 %   Platelets 286 150 - 400 K/uL   nRBC 0.0 0.0 - 0.2 %   Neutrophils Relative % 59 %   Neutro Abs 4.6 1.7 - 7.7 K/uL   Lymphocytes Relative 32 %   Lymphs Abs 2.5 0.7 - 4.0 K/uL   Monocytes Relative 6 %   Monocytes Absolute 0.5 0.1 - 1.0 K/uL   Eosinophils Relative 3 %   Eosinophils Absolute 0.2 0.0 - 0.5 K/uL   Basophils Relative 0 %   Basophils Absolute 0.0 0.0 - 0.1 K/uL   Immature Granulocytes 0 %   Abs Immature Granulocytes 0.01 0.00 - 0.07 K/uL  Basic metabolic panel  Result Value Ref Range   Sodium 137 135 - 145 mmol/L   Potassium 4.0 3.5 - 5.1 mmol/L   Chloride 104 98 - 111 mmol/L   CO2 25 22 - 32 mmol/L   Glucose, Bld 113 (H) 70 - 99 mg/dL   BUN 21 (H) 6 - 20 mg/dL   Creatinine, Ser 7.00 0.44 - 1.00 mg/dL   Calcium 8.8 (L) 8.9 - 10.3 mg/dL   GFR calc non Af Amer >60 >60 mL/min   GFR calc Af Amer >60 >60 mL/min   Anion gap 8 5 - 15  Urinalysis, Microscopic (reflex)  Result Value Ref Range   RBC / HPF >50 0 - 5 RBC/hpf    WBC, UA 6-10 0 - 5 WBC/hpf   Bacteria, UA FEW (A) NONE SEEN   Squamous Epithelial / LPF 0-5 0 - 5   Mucus PRESENT    Urine-Other URINALYSIS PERFORMED ON SUPERNATANT    No results found.  None  Radiology No results found.  Procedures Procedures (including critical care time)  Medications Ordered in ED Medications - No data to display  ED Course  I have reviewed the triage vital signs and the nursing notes.  Pertinent labs & imaging results that were available during my care of the patient were reviewed by me and considered in my medical decision making (see chart for details).   Patient had beta HCG of greater than 12000 at [facility] on the 15th, today it is 4223 I suspect this is a miscarriage.  I have advised patient call her OB GYN for close follow up.  Please continue pelvic rest.  If bleeding worsens go to the closest ED with GYN coverage.    Rada Zegers was evaluated in Emergency Department on 04/24/2019 for the symptoms described in the history of present illness. She was evaluated in the context of the global COVID-19 pandemic, which necessitated consideration that the patient might be at risk for infection with the SARS-CoV-2 virus that causes COVID-19. Institutional protocols and algorithms that pertain to the evaluation of patients at risk for COVID-19 are in a state of rapid change based on information released by regulatory bodies including the CDC and federal and state organizations. These policies and algorithms were followed during the patient's care in the ED.  Final Clinical Impression(s) / ED Diagnoses Final diagnoses:  Vaginal bleeding in pregnancy  Threatened miscarriage    Return for weakness, numbness, changes in vision or speech, fevers >100.4 unrelieved by medication, shortness of breath, intractable vomiting, or diarrhea, abdominal pain, Inability to tolerate liquids or food, cough, altered mental status or any concerns. No signs of systemic illness or  infection. The patient is nontoxic-appearing on exam and vital signs are within normal limits.   I have reviewed the triage vital signs and the nursing notes. Pertinent labs &imaging results that were available during my care of the patient were reviewed by me and considered in my medical decision making (see chart for details).  After history, exam, and medical workup I feel the patient has been appropriately medically screened and is safe for discharge home. Pertinent diagnoses were discussed with the patient. Patient was given return precautions      Avannah Decker, MD 04/24/19 [phone]

## 2019-04-24 NOTE — ED Triage Notes (Signed)
Bleeding x 2 weeks, MD did [place] last week & pt states she was told she was [redacted] weeks along. Hospital sat due to blood clot passing. Had OB appt Thursday but cancelled due to ice. Pt concerned she is having a miscarriage.

## 2020-09-16 ENCOUNTER — Emergency Department (HOSPITAL_BASED_OUTPATIENT_CLINIC_OR_DEPARTMENT_OTHER)
Admission: EM | Admit: 2020-09-16 | Discharge: 2020-09-16 | Disposition: A | Discharge status: Home / Self Care | Payer: Medicaid Other | Attending: Emergency Medicine | Admitting: Emergency Medicine

## 2020-09-16 ENCOUNTER — Other Ambulatory Visit: Payer: Self-pay

## 2020-09-16 ENCOUNTER — Encounter (HOSPITAL_BASED_OUTPATIENT_CLINIC_OR_DEPARTMENT_OTHER): Payer: Self-pay | Admitting: *Deleted

## 2020-09-16 DIAGNOSIS — R5383 Other fatigue: Secondary | ICD-10-CM | POA: Diagnosis not present

## 2020-09-16 DIAGNOSIS — R630 Anorexia: Secondary | ICD-10-CM | POA: Insufficient documentation

## 2020-09-16 DIAGNOSIS — Z20822 Contact with and (suspected) exposure to covid-19: Secondary | ICD-10-CM | POA: Diagnosis not present

## 2020-09-16 DIAGNOSIS — R1033 Periumbilical pain: Secondary | ICD-10-CM

## 2020-09-16 DIAGNOSIS — R6883 Chills (without fever): Secondary | ICD-10-CM | POA: Insufficient documentation

## 2020-09-16 DIAGNOSIS — Z87891 Personal history of nicotine dependence: Secondary | ICD-10-CM | POA: Insufficient documentation

## 2020-09-16 DIAGNOSIS — R110 Nausea: Secondary | ICD-10-CM | POA: Diagnosis not present

## 2020-09-16 LAB — CBC WITH DIFFERENTIAL/PLATELET
Abs Immature Granulocytes: 0 10*3/uL (ref 0.00–0.07)
Basophils Absolute: 0 10*3/uL (ref 0.0–0.1)
Basophils Relative: 0 %
Eosinophils Absolute: 0.1 10*3/uL (ref 0.0–0.5)
Eosinophils Relative: 2 %
HCT: 39.8 % (ref 36.0–46.0)
Hemoglobin: 13.1 g/dL (ref 12.0–15.0)
Immature Granulocytes: 0 %
Lymphocytes Relative: 24 %
Lymphs Abs: 1 10*3/uL (ref 0.7–4.0)
MCH: 28.8 pg (ref 26.0–34.0)
MCHC: 32.9 g/dL (ref 30.0–36.0)
MCV: 87.5 fL (ref 80.0–100.0)
Monocytes Absolute: 0.5 10*3/uL (ref 0.1–1.0)
Monocytes Relative: 11 %
Neutro Abs: 2.6 10*3/uL (ref 1.7–7.7)
Neutrophils Relative %: 63 %
Platelets: 273 10*3/uL (ref 150–400)
RBC: 4.55 MIL/uL (ref 3.87–5.11)
RDW: 13.8 % (ref 11.5–15.5)
WBC: 4.1 10*3/uL (ref 4.0–10.5)
nRBC: 0 % (ref 0.0–0.2)

## 2020-09-16 LAB — URINALYSIS, MICROSCOPIC (REFLEX)

## 2020-09-16 LAB — RESP PANEL BY RT-PCR (FLU A&B, COVID) ARPGX2
Influenza A by PCR: NEGATIVE
Influenza B by PCR: NEGATIVE
SARS Coronavirus 2 by RT PCR: NEGATIVE

## 2020-09-16 LAB — COMPREHENSIVE METABOLIC PANEL
ALT: 14 U/L (ref 0–44)
AST: 18 U/L (ref 15–41)
Albumin: 3.7 g/dL (ref 3.5–5.0)
Alkaline Phosphatase: 83 U/L (ref 38–126)
Anion gap: 6 (ref 5–15)
BUN: 14 mg/dL (ref 6–20)
CO2: 24 mmol/L (ref 22–32)
Calcium: 7.8 mg/dL — ABNORMAL LOW (ref 8.9–10.3)
Chloride: 104 mmol/L (ref 98–111)
Creatinine, Ser: 0.69 mg/dL (ref 0.44–1.00)
GFR, Estimated: >60 mL/min (ref >60)
Glucose, Bld: 86 mg/dL (ref 70–99)
Potassium: 4 mmol/L (ref 3.5–5.1)
Sodium: 134 mmol/L — ABNORMAL LOW (ref 135–145)
Total Bilirubin: 0.6 mg/dL (ref 0.3–1.2)
Total Protein: 7.9 g/dL (ref 6.5–8.1)

## 2020-09-16 LAB — URINALYSIS, ROUTINE W REFLEX MICROSCOPIC
Bilirubin Urine: NEGATIVE
Glucose, UA: NEGATIVE mg/dL
Ketones, ur: NEGATIVE mg/dL
Nitrite: NEGATIVE
Protein, ur: NEGATIVE mg/dL
Specific Gravity, Urine: >1.03 — ABNORMAL HIGH (ref 1.005–1.030)
pH: 6 (ref 5.0–8.0)

## 2020-09-16 LAB — PREGNANCY, URINE: Preg Test, Ur: NEGATIVE

## 2020-09-16 MED ORDER — DICYCLOMINE HCL 10 MG PO CAPS
10.0000 mg | ORAL_CAPSULE | Freq: Once | ORAL | Status: AC
  Administered 2020-09-16: 10 mg via ORAL
  Filled 2020-09-16: qty 1

## 2020-09-16 MED ORDER — ACETAMINOPHEN 500 MG PO TABS
1000.0000 mg | ORAL_TABLET | Freq: Once | ORAL | Status: AC
  Administered 2020-09-16: 1000 mg via ORAL
  Filled 2020-09-16: qty 2

## 2020-09-16 MED ORDER — ONDANSETRON 4 MG PO TBDP
4.0000 mg | ORAL_TABLET | Freq: Once | ORAL | Status: AC
  Administered 2020-09-16: 4 mg via ORAL
  Filled 2020-09-16: qty 1

## 2020-09-16 NOTE — ED Provider Notes (Signed)
[facility] EMERGENCY DEPARTMENT Provider Note   CSN: [id number] Arrival date & time: 09/16/20  0932     History No chief complaint on file.   Laura Salinas is a 35 y.o. female.  Patient is a 35 year old female presenting today with complaints of abdominal pain and generally not feeling well.  Patient reports she always has abdominal pain and GI issues that come and grow regularly but this is much worse than normal.  It started around noon yesterday and she states that it is in her periumbilical region.  It does not radiate.  She had poor appetite throughout the day yesterday was fatigued and sleeping more and had chills but did not take her temperature.  She has had some nausea but denies any vomiting or diarrhea.  Last bowel movement was 2 days ago.  She has no urinary symptoms.  Last menses was 3 weeks ago and normal.  She denies any vaginal discharge itching or burning.  She takes no medications regularly but did take an Advil this morning because of how she was feeling but it has not helped.  She denies cough, congestion or shortness of breath.  At work there have been multiple people out with COVID and her coworker wanted her to be tested.  She denies any abdominal surgeries other than a D&C several years ago.  She does not use tobacco, alcohol or drugs.  The history is provided by the patient.      History reviewed. No pertinent past medical history.  There are no problems to display for this patient.   History reviewed. No pertinent surgical history.   OB History     Gravida  1   Para      Term      Preterm      AB      Living         SAB      IAB      Ectopic      Multiple      Live Births              History reviewed. No pertinent family history.  Social History   Tobacco Use   Smoking status: Former   Smokeless tobacco: Never  Substance Use Topics   Alcohol use: Yes   Drug use: No    Home Medications Prior to Admission  medications   Medication Sig Start Date End Date Taking? Authorizing Provider  HYDROcodone-acetaminophen (NORCO) 5-325 MG tablet Take 1-2 tablets by mouth every 6 (six) hours as needed. 01/31/16   Geoffery Lyons, MD    Allergies    Penicillins  Review of Systems   Review of Systems  All other systems reviewed and are negative.  Physical Exam Updated Vital Signs BP 124/78 (BP Location: Right Arm)   Pulse 70   Temp 97.8 F (36.6 C) (Oral)   Resp 18   Ht 4\' 11"  (1.499 m)   Wt 89.4 kg   SpO2 100%   BMI 39.79 kg/m   Physical Exam Vitals and nursing note reviewed.  Constitutional:      General: She is not in acute distress.    Appearance: Normal appearance. She is well-developed.  HENT:     Head: Normocephalic and atraumatic.  Eyes:     Pupils: Pupils are equal, round, and reactive to light.  Cardiovascular:     Rate and Rhythm: Normal rate and regular rhythm.     Heart sounds: Normal heart sounds. No murmur  heard.   No friction rub.  Pulmonary:     Effort: Pulmonary effort is normal.     Breath sounds: Normal breath sounds. No wheezing or rales.  Abdominal:     General: Bowel sounds are normal. There is no distension.     Palpations: Abdomen is soft.     Tenderness: There is abdominal tenderness in the periumbilical area. There is no right CVA tenderness, left CVA tenderness, guarding or rebound.  Musculoskeletal:        General: No tenderness. Normal range of motion.     Right lower leg: No edema.     Left lower leg: No edema.     Comments: No edema  Skin:    General: Skin is warm and dry.     Findings: No rash.  Neurological:     General: No focal deficit present.     Mental Status: She is alert and oriented to person, place, and time. Mental status is at baseline.     Cranial Nerves: No cranial nerve deficit.  Psychiatric:        Mood and Affect: Mood normal.        Behavior: Behavior normal.    ED Results / Procedures / Treatments   Labs (all labs ordered  are listed, but only abnormal results are displayed) Labs Reviewed  URINALYSIS, ROUTINE W REFLEX MICROSCOPIC - Abnormal; Notable for the following components:      Result Value   APPearance CLOUDY (*)    Specific Gravity, Urine >1.030 (*)    Hgb urine dipstick TRACE (*)    Leukocytes,Ua TRACE (*)    All other components within normal limits  COMPREHENSIVE METABOLIC PANEL - Abnormal; Notable for the following components:   Sodium 134 (*)    Calcium 7.8 (*)    All other components within normal limits  URINALYSIS, MICROSCOPIC (REFLEX) - Abnormal; Notable for the following components:   Bacteria, UA MANY (*)    All other components within normal limits  RESP PANEL BY RT-PCR (FLU A&B, COVID) ARPGX2  PREGNANCY, URINE  CBC WITH DIFFERENTIAL/PLATELET    EKG None  Radiology No results found.  Procedures Procedures   Medications Ordered in ED Medications  dicyclomine (BENTYL) capsule 10 mg (has no administration in time range)  ondansetron (ZOFRAN-ODT) disintegrating tablet 4 mg (has no administration in time range)    ED Course  I have reviewed the triage vital signs and the nursing notes.  Pertinent labs & imaging results that were available during my care of the patient were reviewed by me and considered in my medical decision making (see chart for details).    MDM Rules/Calculators/A&P                          Patient presenting today with nonspecific abdominal pain as well as generalized myalgias.  This all started yesterday around noon.  Denies any recent travel or bad food exposure.  Multiple positive COVID contacts at work.  She has no respiratory complaints at this time.  Decreased appetite but no urinary or vaginal complaints.  Low suspicion for kidney stone, pregnancy, PID.  Based on exam low suspicion for appendicitis, pancreatitis or hepatitis.  Patient reports that she always has chronic abdominal cramping and issues but is more pronounced today.  No peritoneal  findings on exam.  Patient given Bentyl, Tylenol and ODT Zofran.  Will check COVID, urine, urine pregnancy and blood.  10:18 AM Since labs are within  normal limits.  Urine is slightly cloudy but is contaminated without definitive signs of UTI as well as patient not having symptoms.  UPT is negative.  COVID is negative.  Suspect patient most likely has a viral etiology as she reports her daughter has been sick for a few days before she started feeling bad.  Bentyl, Tylenol and Zofran did not improve her symptoms significantly.  Discussed with her continuing Tylenol, ibuprofen and Maalox.  Following up with GI as the abdominal issues appear to be more longstanding.  She has had abdominal ultrasounds and CTs in the past without definitive findings.  No peritoneal signs at this time to suggest CT scan is necessary.  Patient is tolerating p.o.'s.  She was given return precautions and GI follow-up.  MDM   Amount and/or Complexity of Data Reviewed Tests in the radiology section of CPT: ordered and reviewed Independent visualization of images, tracings, or specimens: yes     Final Clinical Impression(s) / ED Diagnoses Final diagnoses:  Periumbilical abdominal pain    Rx / DC Orders ED Discharge Orders     None        Gwyneth Sprout, MD 09/16/20 1019

## 2020-09-16 NOTE — ED Triage Notes (Signed)
Yesterday began having abd pain, poor appetite, slept more than usual. Had some nausea, denies any vomiting or diarrhea. She states she had chills but her temp was normal.

## 2020-09-16 NOTE — Discharge Instructions (Addendum)
All the labs today look normal.  Concern you may have a bug that is making your pain that you already have in your stomach worse.  Keep taking Tylenol, Maalox or Pepto-Bismol as needed.  Eat a very bland diet and make sure you are drinking plenty of fluids

## 2021-07-19 ENCOUNTER — Emergency Department (HOSPITAL_BASED_OUTPATIENT_CLINIC_OR_DEPARTMENT_OTHER): Payer: Medicaid Other

## 2021-07-19 ENCOUNTER — Emergency Department (HOSPITAL_BASED_OUTPATIENT_CLINIC_OR_DEPARTMENT_OTHER)
Admission: EM | Admit: 2021-07-19 | Discharge: 2021-07-19 | Disposition: A | Discharge status: Home / Self Care | Payer: Medicaid Other | Attending: Emergency Medicine | Admitting: Emergency Medicine

## 2021-07-19 ENCOUNTER — Other Ambulatory Visit: Payer: Self-pay

## 2021-07-19 ENCOUNTER — Encounter (HOSPITAL_BASED_OUTPATIENT_CLINIC_OR_DEPARTMENT_OTHER): Payer: Self-pay

## 2021-07-19 DIAGNOSIS — W19XXXA Unspecified fall, initial encounter: Secondary | ICD-10-CM | POA: Diagnosis not present

## 2021-07-19 DIAGNOSIS — M546 Pain in thoracic spine: Secondary | ICD-10-CM | POA: Insufficient documentation

## 2021-07-19 DIAGNOSIS — S0191XA Laceration without foreign body of unspecified part of head, initial encounter: Secondary | ICD-10-CM | POA: Insufficient documentation

## 2021-07-19 DIAGNOSIS — N9489 Other specified conditions associated with female genital organs and menstrual cycle: Secondary | ICD-10-CM | POA: Diagnosis not present

## 2021-07-19 DIAGNOSIS — M79632 Pain in left forearm: Secondary | ICD-10-CM | POA: Insufficient documentation

## 2021-07-19 DIAGNOSIS — S060X1A Concussion with loss of consciousness of 30 minutes or less, initial encounter: Secondary | ICD-10-CM

## 2021-07-19 DIAGNOSIS — S0990XA Unspecified injury of head, initial encounter: Secondary | ICD-10-CM

## 2021-07-19 LAB — CBC WITH DIFFERENTIAL/PLATELET
Abs Immature Granulocytes: 0.03 10*3/uL (ref 0.00–0.07)
Basophils Absolute: 0 10*3/uL (ref 0.0–0.1)
Basophils Relative: 0 %
Eosinophils Absolute: 0.2 10*3/uL (ref 0.0–0.5)
Eosinophils Relative: 2 %
HCT: 38.7 % (ref 36.0–46.0)
Hemoglobin: 12.7 g/dL (ref 12.0–15.0)
Immature Granulocytes: 0 %
Lymphocytes Relative: 26 %
Lymphs Abs: 2.4 10*3/uL (ref 0.7–4.0)
MCH: 28.5 pg (ref 26.0–34.0)
MCHC: 32.8 g/dL (ref 30.0–36.0)
MCV: 86.8 fL (ref 80.0–100.0)
Monocytes Absolute: 0.7 10*3/uL (ref 0.1–1.0)
Monocytes Relative: 7 %
Neutro Abs: 6 10*3/uL (ref 1.7–7.7)
Neutrophils Relative %: 65 %
Platelets: 394 10*3/uL (ref 150–400)
RBC: 4.46 MIL/uL (ref 3.87–5.11)
RDW: 13.5 % (ref 11.5–15.5)
WBC: 9.4 10*3/uL (ref 4.0–10.5)
nRBC: 0 % (ref 0.0–0.2)

## 2021-07-19 LAB — TROPONIN I (HIGH SENSITIVITY): Troponin I (High Sensitivity): 3 ng/L (ref <18)

## 2021-07-19 LAB — HCG, SERUM, QUALITATIVE: Preg, Serum: NEGATIVE

## 2021-07-19 LAB — BASIC METABOLIC PANEL
Anion gap: 6 (ref 5–15)
BUN: 16 mg/dL (ref 6–20)
CO2: 27 mmol/L (ref 22–32)
Calcium: 8.5 mg/dL — ABNORMAL LOW (ref 8.9–10.3)
Chloride: 104 mmol/L (ref 98–111)
Creatinine, Ser: 0.74 mg/dL (ref 0.44–1.00)
GFR, Estimated: >60 mL/min (ref >60)
Glucose, Bld: 82 mg/dL (ref 70–99)
Potassium: 3.8 mmol/L (ref 3.5–5.1)
Sodium: 137 mmol/L (ref 135–145)

## 2021-07-19 MED ORDER — ACETAMINOPHEN 500 MG PO TABS
1000.0000 mg | ORAL_TABLET | Freq: Once | ORAL | Status: AC
  Administered 2021-07-19: 1000 mg via ORAL
  Filled 2021-07-19: qty 2

## 2021-07-19 NOTE — ED Provider Notes (Addendum)
[facility] EMERGENCY DEPARTMENT Provider Note   CSN: [id number] Arrival date & time: 07/19/21  1414     History  Chief Complaint  Patient presents with   Fall    Head injury    Laura Salinas is a 36 y.o. female with no pertinent past medical history.  Presents emergency department with a chief complaint of headache, left forearm pain, and back pain after suffering a fall.  Patient reports that this morning at approximately 1 AM she was walking upstairs when she lost her balance and fell backwards.  Patient reports that she fell down 3 steps.  Patient endorses feeling lightheaded prior to falling.  Endorses hitting her head and losing consciousness.  Patient is not on any blood thinners.  Patient reports that she vomited twice after her head injury.  Patient states that she has had a headache since her injury.  Pain is located to occipital region and worse with bright lights.  Patient has not tried any modalities to alleviate her pain.  Patient reports having blurry vision earlier today however reports that this has resolved.  Patient reports that she had multiple alcoholic beverages yesterday evening.  Patient reports that she does not drink on a regular basis.  Denies any illicit drug use.  Patient reports that she has had thoracic back pain and pain to the left forearm since her fall.  Reports that she has had swelling to left forearm as well.  Patient is right-hand dominant.  Denies any numbness, weakness, saddle anesthesia, bowel/bladder dysfunction, neck pain, diplopia, vision loss, facial asymmetry, dysarthria, chest pain, shortness of breath, abdominal pain.  LMP 5/12.  Patient reports that she has not been sexually active since then.  Reports that last tetanus shot was 3 years prior when she was pregnant.   Fall Pertinent negatives include no chest pain, no abdominal pain, no headaches and no shortness of breath.      Home Medications Prior to Admission medications    Medication Sig Start Date End Date Taking? Authorizing Provider  HYDROcodone-acetaminophen (NORCO) 5-325 MG tablet Take 1-2 tablets by mouth every 6 (six) hours as needed. 01/31/16   Geoffery Lyonselo, Douglas, MD      Allergies    Penicillins    Review of Systems   Review of Systems  Constitutional:  Negative for chills and fever.  Eyes:  Positive for photophobia and visual disturbance.  Respiratory:  Negative for shortness of breath.   Cardiovascular:  Negative for chest pain.  Gastrointestinal:  Negative for abdominal pain, nausea and vomiting.  Genitourinary:  Negative for difficulty urinating.  Musculoskeletal:  Positive for arthralgias, back pain and myalgias. Negative for neck pain.  Skin:  Positive for wound. Negative for color change and rash.  Neurological:  Positive for light-headedness. Negative for dizziness, tremors, seizures, syncope, facial asymmetry, speech difficulty, weakness, numbness and headaches.  Psychiatric/Behavioral:  Negative for confusion.    Physical Exam Updated Vital Signs BP 128/81 (BP Location: Left Arm)   Pulse 68   Temp 97.9 F (36.6 C) (Oral)   Resp 20   Ht 4\' 9"  (1.448 m)   Wt 93 kg   LMP  (Within Weeks)   SpO2 99%   BMI 44.36 kg/m  Physical Exam Vitals and nursing note reviewed.  Constitutional:      General: She is not in acute distress.    Appearance: She is not ill-appearing, toxic-appearing or diaphoretic.  HENT:     Head: Normocephalic. Laceration present. No raccoon eyes, Battle's sign,  abrasion, contusion, right periorbital erythema or left periorbital erythema.     Jaw: No trismus.     Comments: 1 cm laceration to posterior aspect of patient's head.  Wound edges are straight and well approximated. Eyes:     General: No scleral icterus.       Right eye: No discharge.        Left eye: No discharge.  Cardiovascular:     Rate and Rhythm: Normal rate.  Pulmonary:     Effort: Pulmonary effort is normal.  Abdominal:     General: Abdomen  is protuberant. There is no distension. There are no signs of injury.     Palpations: Abdomen is soft. There is no mass or pulsatile mass.     Tenderness: There is no abdominal tenderness. There is no guarding or rebound.  Musculoskeletal:     Right upper arm: Normal.     Left upper arm: Normal.     Right elbow: Normal.     Left elbow: Normal.     Right forearm: Normal.     Left forearm: Swelling and tenderness present. No edema, deformity, lacerations or bony tenderness.     Right wrist: Normal.     Left wrist: No swelling, deformity, effusion, lacerations, tenderness, bony tenderness, snuff box tenderness or crepitus. Decreased range of motion. Normal pulse.     Right hand: No swelling, deformity, lacerations, tenderness or bony tenderness. Normal range of motion. Normal strength. Normal sensation. Normal capillary refill.     Left hand: No swelling, deformity, lacerations, tenderness or bony tenderness. Normal range of motion. Normal strength. Normal sensation. Normal capillary refill.     Cervical back: No swelling, edema, deformity, erythema, signs of trauma, lacerations, rigidity, spasms, torticollis, tenderness, bony tenderness or crepitus. No pain with movement. Normal range of motion.     Thoracic back: Tenderness present. No swelling, edema, deformity, signs of trauma, lacerations, spasms or bony tenderness.     Lumbar back: No swelling, edema, deformity, signs of trauma, lacerations, spasms, tenderness or bony tenderness.     Comments: No midline tenderness or deformity to cervical, thoracic, lumbar spine.  Tenderness to bilateral thoracic back.    Swelling and tenderness to distal posterior aspect of left forearm.  Decreased range of motion to left wrist secondary to complaints of pain pulse, motor, and sensation intact distally.  Skin:    General: Skin is warm and dry.  Neurological:     General: No focal deficit present.     Mental Status: She is alert and oriented to person,  place, and time.     GCS: GCS eye subscore is 4. GCS verbal subscore is 5. GCS motor subscore is 6.     Comments: No facial symmetry or dysarthria.  Patient moves all limbs equally without difficulty  Psychiatric:        Behavior: Behavior is cooperative.    ED Results / Procedures / Treatments   Labs (all labs ordered are listed, but only abnormal results are displayed) Labs Reviewed  BASIC METABOLIC PANEL - Abnormal; Notable for the following components:      Result Value   Calcium 8.5 (*)    All other components within normal limits  CBC WITH DIFFERENTIAL/PLATELET  HCG, SERUM, QUALITATIVE  TROPONIN I (HIGH SENSITIVITY)    EKG None  Radiology DG Thoracic Spine 2 View  Result Date: 07/19/2021 CLINICAL DATA:  Larey Seat, landed on back, pain EXAM: THORACIC SPINE 2 VIEWS COMPARISON:  None Available. FINDINGS: Frontal and lateral  views of the thoracic spine are obtained. There is gentle S shaped scoliosis of the thoracolumbar spine. Otherwise alignment is anatomic. No acute fractures. Disc spaces are well preserved. Paraspinal soft tissues are unremarkable. IMPRESSION: 1. No acute thoracic spine fracture. 2. Gentle S-shaped scoliosis. Electronically Signed   By: Sharlet Salina M.D.   On: 07/19/2021 16:42   DG Wrist Complete Left  Result Date: 07/19/2021 CLINICAL DATA:  Left wrist pain and swelling after fall on outstretched hand EXAM: LEFT WRIST - COMPLETE 3+ VIEW COMPARISON:  None Available. FINDINGS: There is no evidence of fracture or dislocation. There is no evidence of arthropathy or other focal bone abnormality. Soft tissue swelling of the distal forearm. IMPRESSION: No fracture or dislocation, left wrist. Soft tissue swelling of the distal forearm. Electronically Signed   By: Duanne Guess D.O.   On: 07/19/2021 15:24   CT Head Wo Contrast  Result Date: 07/19/2021 CLINICAL DATA:  Head trauma, repeat vomiting EXAM: CT HEAD WITHOUT CONTRAST TECHNIQUE: Contiguous axial images were  obtained from the base of the skull through the vertex without intravenous contrast. RADIATION DOSE REDUCTION: This exam was performed according to the departmental dose-optimization program which includes automated exposure control, adjustment of the mA and/or kV according to patient size and/or use of iterative reconstruction technique. COMPARISON:  10/15/2006 FINDINGS: Brain: The brainstem, cerebellum, cerebral peduncles, thalami, basal ganglia, basilar cisterns, and ventricular system appear within normal limits. No intracranial hemorrhage, mass lesion, or acute CVA. 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 Other: Absent right posterior arch of C1, likely congenital, and unchanged from 10/15/2006. IMPRESSION: 1. No acute intracranial findings. 2. 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 3. 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 Electronically Signed   By: Gaylyn Rong M.D.   On: 07/19/2021 16:49    Procedures Procedures    Medications Ordered in ED Medications  acetaminophen (TYLENOL) tablet 1,000 mg (1,000 mg Oral Given 07/19/21 1655)    ED Course/ Medical Decision Making/ A&P                           Medical Decision Making Amount and/or Complexity of Data Reviewed Labs: ordered. Radiology: ordered.  Risk OTC drugs.   Alert 36 year old female in no acute distress, nontoxic-appearing.  Presents to the ED with a chief complaint of headache, thoracic back pain, and left forearm pain after suffering a fall.  Information is obtained from patient.  Past medical records reviewed including previous provider notes and labs.    Patient reports drinking multiple alcoholic beverages.  Does remember being lightheaded before her syncopal episode.  Patient is unable to recall the exact mechanism of her fall.  Due to this fact we will obtain basic lab work, EKG, and  troponin to look for possible metabolic abnormalities that could have caused patient's syncopal episode.  Due to reports of head injury with multiple episodes of vomiting will obtain CT head to look for intracranial hemorrhage.  With reports of back pain will obtain x-ray imaging to look for acute osseous abnormality.  I personally viewed and interpreted patient's EKG.  Tracing shows sinus rhythm.  I personally viewed and interpreted patient's x-ray imaging.  Imaging shows no acute osseous abnormality to the left wrist or thoracic spine.  I personally viewed and interpreted patient CT imaging.  Agree with radiology interpretation of no acute intracranial abnormality.  I personally  viewed interpret patient's lab results.  Pertinent findings include: -Troponin 3 -CBC unremarkable -Qualitative serum pregnancy test negative -BMP unremarkable  BMP sample hemolyzed, redraw of labs were obtained however this delayed patient's care.   Suspect that patient has a concussion with reports of headache and blurred vision after head injury.  Patient to follow-up with PCP next week for repeat evaluation.  Suspect that patient's fall was caused by instability secondary to EtOH.  Based on patient's chief complaint, I considered admission might be necessary, however after reassuring ED workup feel patient is reasonable for discharge.  Discussed results, findings, treatment and follow up. Patient advised of return precautions. Patient verbalized understanding and agreed with plan.  Portions of this note were generated with Scientist, clinical (histocompatibility and immunogenetics). Dictation errors may occur despite best attempts at proofreading.         Final Clinical Impression(s) / ED Diagnoses Final diagnoses:  None    Rx / DC Orders ED Discharge Orders     None         Haskel Schroeder, PA-C 07/19/21 1842    Haskel Schroeder, PA-C 07/19/21 1844    Haskel Schroeder, PA-C 07/19/21 1850     Alvira Monday, MD 07/20/21 [phone]

## 2021-07-19 NOTE — ED Notes (Signed)
Patient transported at this Time to Imaging.

## 2021-07-19 NOTE — Discharge Instructions (Addendum)
You came to the emergency department today to be evaluated for your injuries after sustaining a fall.  The x-ray obtained of your left wrist did not show any broken bones or dislocations.    The x-ray of your thoracic back did not show any obvious fractures or dislocations.  Sometimes subtle fractures can be missed on x-ray imaging.  Therefore, if you have worsening back pain or your pain does not improve in 7 to 10 days please have further evaluation for CT imaging.    The CT scan of your head did not show any brain bleeds or skull fractures.  Your headache and blurry vision may be due to a concussion.  Please read the attached paperwork on concussion.  You will need to follow-up with your primary care doctor next week for repeat evaluation.  Please take Ibuprofen (Advil, motrin) and Tylenol (acetaminophen) to relieve your pain.    You may take up to 600 MG (3 pills) of normal strength ibuprofen every 8 hours as needed.   You make take tylenol, up to 1,000 mg (two extra strength pills) every 8 hours as needed.   It is safe to take ibuprofen and tylenol at the same time as they work differently.   Do not take more than 3,000 mg tylenol in a 24 hour period (not more than one dose every 8 hours.  Please check all medication labels as many medications such as pain and cold medications may contain tylenol.  Do not drink alcohol while taking these medications.  Do not take other NSAID'S while taking ibuprofen (such as aleve or naproxen).  Please take ibuprofen with food to decrease stomach upset.  Get help right away if: You have: A severe headache that is not helped by medicine. Trouble walking or weakness in your arms and legs. Clear or bloody fluid coming from your nose or ears. Changes in your vision. A seizure. Increased confusion or irritability. Your symptoms get worse. You are sleepier than normal and have trouble staying awake. You lose your balance. Your pupils change size. Your speech  is slurred. Your dizziness gets worse. You vomit.

## 2021-07-19 NOTE — ED Notes (Signed)
PA at the Bedside. ?

## 2021-07-19 NOTE — ED Triage Notes (Signed)
Pt states fell last night hit posterior head and left wrist. States brief loss of conciousness.  Denies blurry vision, does report photosensitivity.  Ambulatory with steady gait.  Unsure what caused her to fall.Abrasion posterior scalp and swelling left wrist.

## 2021-07-19 NOTE — ED Notes (Signed)
RN provided AVS using Teachback Method. Patient verbalizes understanding of Discharge Instructions. Opportunity for Questioning and Answers were provided by RN. Patient Discharged from ED ambulatory to Home via Self.  

## 2022-10-16 ENCOUNTER — Other Ambulatory Visit: Payer: Self-pay

## 2022-10-16 ENCOUNTER — Encounter (HOSPITAL_BASED_OUTPATIENT_CLINIC_OR_DEPARTMENT_OTHER): Payer: Self-pay

## 2022-10-16 DIAGNOSIS — Z87891 Personal history of nicotine dependence: Secondary | ICD-10-CM | POA: Diagnosis not present

## 2022-10-16 DIAGNOSIS — U071 COVID-19: Secondary | ICD-10-CM | POA: Insufficient documentation

## 2022-10-16 DIAGNOSIS — R079 Chest pain, unspecified: Secondary | ICD-10-CM | POA: Diagnosis present

## 2022-10-16 LAB — BASIC METABOLIC PANEL
Anion gap: 8 (ref 5–15)
BUN: 11 mg/dL (ref 6–20)
CO2: 24 mmol/L (ref 22–32)
Calcium: 8.1 mg/dL — ABNORMAL LOW (ref 8.9–10.3)
Chloride: 103 mmol/L (ref 98–111)
Creatinine, Ser: 0.76 mg/dL (ref 0.44–1.00)
GFR, Estimated: >60 mL/min (ref >60)
Glucose, Bld: 127 mg/dL — ABNORMAL HIGH (ref 70–99)
Potassium: 3.5 mmol/L (ref 3.5–5.1)
Sodium: 135 mmol/L (ref 135–145)

## 2022-10-16 LAB — CBC WITH DIFFERENTIAL/PLATELET
Abs Immature Granulocytes: 0.01 10*3/uL (ref 0.00–0.07)
Basophils Absolute: 0 10*3/uL (ref 0.0–0.1)
Basophils Relative: 1 %
Eosinophils Absolute: 0.1 10*3/uL (ref 0.0–0.5)
Eosinophils Relative: 2 %
HCT: 37.8 % (ref 36.0–46.0)
Hemoglobin: 12.3 g/dL (ref 12.0–15.0)
Immature Granulocytes: 0 %
Lymphocytes Relative: 29 %
Lymphs Abs: 1.5 10*3/uL (ref 0.7–4.0)
MCH: 29.1 pg (ref 26.0–34.0)
MCHC: 32.5 g/dL (ref 30.0–36.0)
MCV: 89.6 fL (ref 80.0–100.0)
Monocytes Absolute: 0.6 10*3/uL (ref 0.1–1.0)
Monocytes Relative: 11 %
Neutro Abs: 2.9 10*3/uL (ref 1.7–7.7)
Neutrophils Relative %: 57 %
Platelets: 258 10*3/uL (ref 150–400)
RBC: 4.22 MIL/uL (ref 3.87–5.11)
RDW: 13.2 % (ref 11.5–15.5)
WBC: 5 10*3/uL (ref 4.0–10.5)
nRBC: 0 % (ref 0.0–0.2)

## 2022-10-16 LAB — RESP PANEL BY RT-PCR (RSV, FLU A&B, COVID)  RVPGX2
Influenza A by PCR: NEGATIVE
Influenza B by PCR: NEGATIVE
Resp Syncytial Virus by PCR: NEGATIVE
SARS Coronavirus 2 by RT PCR: POSITIVE — AB

## 2022-10-16 LAB — TROPONIN I (HIGH SENSITIVITY): Troponin I (High Sensitivity): 2 ng/L (ref <18)

## 2022-10-16 NOTE — ED Triage Notes (Signed)
Multiple complaints CP, HA, coughing, abdominal pain, N/V Started 2 days

## 2022-10-17 ENCOUNTER — Emergency Department (HOSPITAL_BASED_OUTPATIENT_CLINIC_OR_DEPARTMENT_OTHER)
Admission: EM | Admit: 2022-10-17 | Discharge: 2022-10-17 | Disposition: A | Discharge status: Home / Self Care | Payer: Medicaid Other | Attending: Emergency Medicine | Admitting: Emergency Medicine

## 2022-10-17 DIAGNOSIS — U071 COVID-19: Secondary | ICD-10-CM

## 2022-10-17 DIAGNOSIS — R079 Chest pain, unspecified: Secondary | ICD-10-CM

## 2022-10-17 MED ORDER — METHOCARBAMOL 500 MG PO TABS: 500.0000 mg | ORAL_TABLET | Freq: Three times a day (TID) | ORAL | 0 refills | Status: DC | PRN

## 2022-10-17 MED ORDER — ONDANSETRON 4 MG PO TBDP: 4.0000 mg | ORAL_TABLET | Freq: Three times a day (TID) | ORAL | 0 refills | Status: AC | PRN

## 2022-10-17 MED ORDER — METHOCARBAMOL 500 MG PO TABS: 500.0000 mg | ORAL_TABLET | Freq: Three times a day (TID) | ORAL | 0 refills | Status: AC | PRN

## 2022-10-17 MED ORDER — NAPROXEN 500 MG PO TABS: 500.0000 mg | ORAL_TABLET | Freq: Two times a day (BID) | ORAL | 0 refills | Status: DC

## 2022-10-17 MED ORDER — ONDANSETRON 4 MG PO TBDP: 4.0000 mg | ORAL_TABLET | Freq: Three times a day (TID) | ORAL | 0 refills | Status: DC | PRN

## 2022-10-17 MED ORDER — NAPROXEN 500 MG PO TABS: 500.0000 mg | ORAL_TABLET | Freq: Two times a day (BID) | ORAL | 0 refills | Status: AC

## 2022-10-17 NOTE — Discharge Instructions (Signed)
You were evaluated in the Emergency Department and after careful evaluation, we did not find any emergent condition requiring admission or further testing in the hospital.  Your exam/testing today is overall reassuring.  Symptoms seem well explained by COVID-19 infection.  Use the Naprosyn twice daily for discomfort.  Use the Robaxin as needed for body aches but use caution as it can cause drowsiness.  Use Zofran as needed for nausea.  Please return to the Emergency Department if you experience any worsening of your condition.   Thank you for allowing [place] to be a part of your care.

## 2022-10-17 NOTE — ED Provider Notes (Signed)
[facility]EMERGENCY DEPT [facility] Emergency Department Provider Note MRN:  [id number]  Arrival date & time: 10/17/22     Chief Complaint   Chest Pain   History of Present Illness   Laura Salinas is a 37 y.o. year-old female with no pertinent past medical history presenting to the ED with chief complaint of chest pain.  Body aches, malaise, fatigue, subjective fever, chest discomfort.  Symptoms for the past 2 days.  Also with cough, nausea.  Review of Systems  A thorough review of systems was obtained and all systems are negative except as noted in the HPI and PMH.   Patient's Health History   History reviewed. No pertinent past medical history.  History reviewed. No pertinent surgical history.  History reviewed. No pertinent family history.  Social History   Socioeconomic History   Marital status: Single    Spouse name: Not on file   Number of children: Not on file   Years of education: Not on file   Highest education level: Not on file  Occupational History   Not on file  Tobacco Use   Smoking status: Former   Smokeless tobacco: Never  Vaping Use   Vaping status: Never Used  Substance and Sexual Activity   Alcohol use: Yes   Drug use: No   Sexual activity: Not on file  Other Topics Concern   Not on file  Social History Narrative   Not on file   Social Determinants of Health   Financial Resource Strain: Not on file  Food Insecurity: Not on file  Transportation Needs: Not on file  Physical Activity: Not on file  Stress: Not on file  Social Connections: Not on file  Intimate Partner Violence: Not on file     Physical Exam   Vitals:   10/16/22 2252 10/17/22 0233  BP: (!) 158/79 (!) 149/81  Pulse: 76 71  Resp: 18 17  Temp: 97.8 F (36.6 C) 98 F (36.7 C)  SpO2: 100% 99%    CONSTITUTIONAL: Well-appearing, NAD NEURO/PSYCH:  Alert and oriented x 3, no focal deficits EYES:  eyes equal and reactive ENT/NECK:  no LAD, no JVD CARDIO: Regular rate,  well-perfused, normal S1 and S2 PULM:  CTAB no wheezing or rhonchi GI/GU:  non-distended, non-tender MSK/SPINE:  No gross deformities, no edema SKIN:  no rash, atraumatic   *Additional and/or pertinent findings included in MDM below  Diagnostic and Interventional Summary    EKG Interpretation Date/Time:  Wednesday October 16 2022 22:54:57 EDT Ventricular Rate:  73 PR Interval:  131 QRS Duration:  100 QT Interval:  403 QTC Calculation: 445 R Axis:   90  Text Interpretation: Sinus rhythm Borderline right axis deviation Borderline repolarization abnormality ST elevation, consider lateral injury Confirmed by Kennis Carina [phone]) on 10/17/2022 2:56:45 AM       Labs Reviewed  RESP PANEL BY RT-PCR (RSV, FLU A&B, COVID)  RVPGX2 - Abnormal; Notable for the following components:      Result Value   SARS Coronavirus 2 by RT PCR POSITIVE (*)    All other components within normal limits  BASIC METABOLIC PANEL - Abnormal; Notable for the following components:   Glucose, Bld 127 (*)    Calcium 8.1 (*)    All other components within normal limits  CBC WITH DIFFERENTIAL/PLATELET  TROPONIN I (HIGH SENSITIVITY)  TROPONIN I (HIGH SENSITIVITY)    No orders to display    Medications - No data to display   Procedures  /  Critical  Care Procedures  ED Course and Medical Decision Making  Initial Impression and Ddx Negative symptoms suggestive of viral illness.  Patient has tested positive for COVID will obtain.  Doubt PE, doubt ACS, doubt dissection.  Patient is sitting comfortably no acute distress with normal vital signs, clear lungs, no increased work of breathing.  Past medical/surgical history that increases complexity of ED encounter: None  Interpretation of Diagnostics I personally reviewed the EKG and my interpretation is as follows: Sinus rhythm, nonspecific findings, no significant change from prior  Labs overall reassuring with no significant blood count or electrolyte  disturbance.  Troponin negative.  Patient Reassessment and Ultimate Disposition/Management     Discharge  Patient management required discussion with the following services or consulting groups:  None  Complexity of Problems Addressed Acute illness or injury that poses threat of life of bodily function  Additional Data Reviewed and Analyzed Further history obtained from: None  Additional Factors Impacting ED Encounter Risk Prescriptions  Elmer Sow. Pilar Plate, MD [facility] Emergency Medicine [facility] [email or web address]  Final Clinical Impressions(s) / ED Diagnoses     ICD-10-CM   1. COVID-19  U07.1     2. Chest pain, unspecified type  R07.9       ED Discharge Orders          Ordered    naproxen (NAPROSYN) 500 MG tablet  2 times daily        10/17/22 0319    methocarbamol (ROBAXIN) 500 MG tablet  Every 8 hours PRN        10/17/22 0319    ondansetron (ZOFRAN-ODT) 4 MG disintegrating tablet  Every 8 hours PRN        10/17/22 0319             Discharge Instructions Discussed with and Provided to Patient:    Discharge Instructions      You were evaluated in the Emergency Department and after careful evaluation, we did not find any emergent condition requiring admission or further testing in the hospital.  Your exam/testing today is overall reassuring.  Symptoms seem well explained by COVID-19 infection.  Use the Naprosyn twice daily for discomfort.  Use the Robaxin as needed for body aches but use caution as it can cause drowsiness.  Use Zofran as needed for nausea.  Please return to the Emergency Department if you experience any worsening of your condition.   Thank you for allowing [place] to be a part of your care.      Sabas Sous, MD 10/17/22 [phone]

## 2023-05-13 IMAGING — CT CT HEAD W/O CM
4 series · 16 of 47 positions shown, 18 images · non-contrast
Comparison: 10/15/2006

CLINICAL DATA: Head trauma, repeat vomiting



[Series 2: head wo · axial · 0.41mm/px · z∈[-505,-395]mm · 7 of 30 slices shown, 9 images]
[im 4/30  brain]
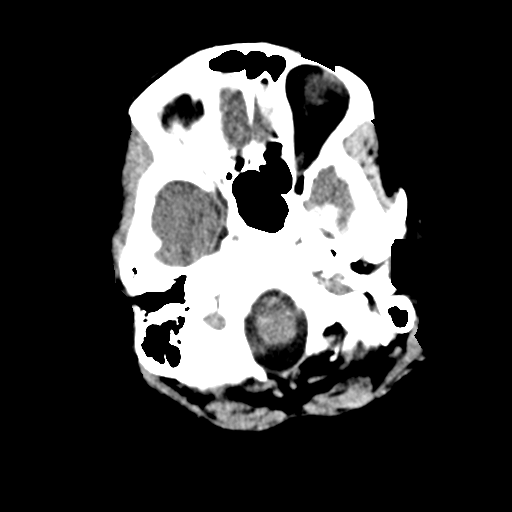
[im 4/30  bone]
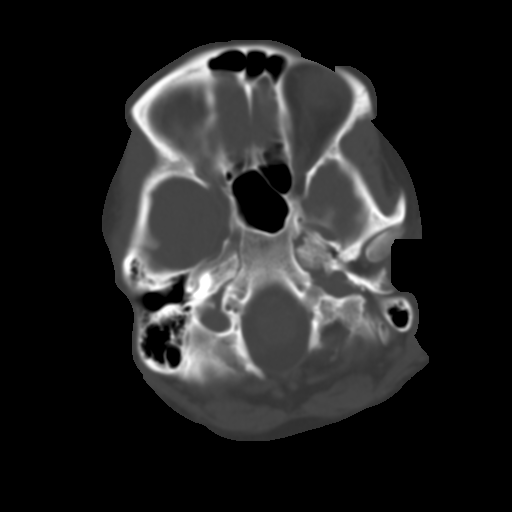
[im 8/30  brain]
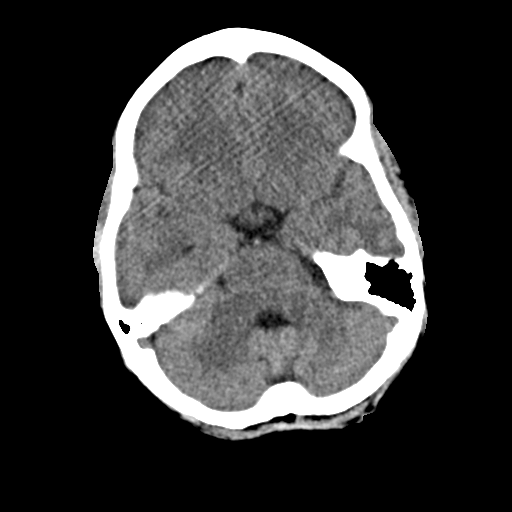
[im 11/30  brain]
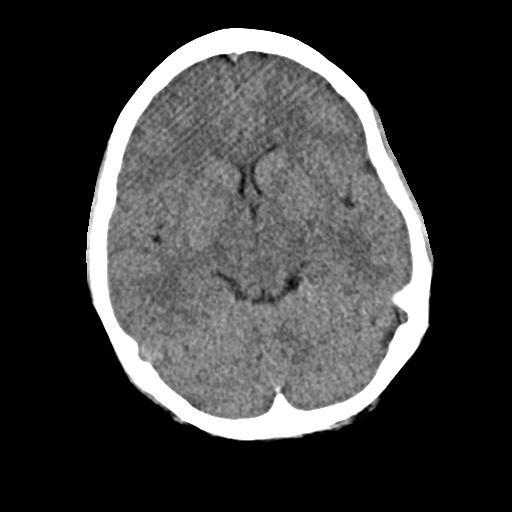
[im 15/30  brain]
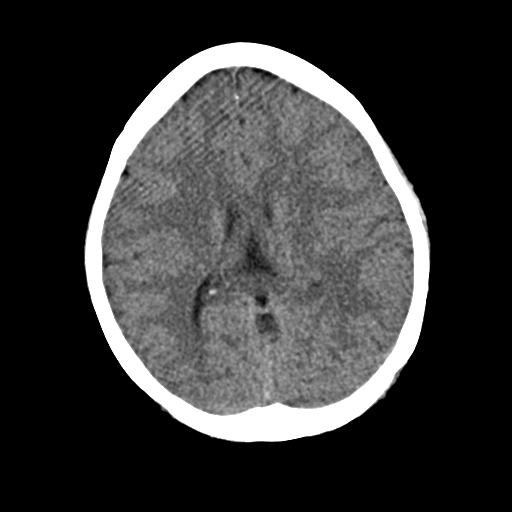
[im 19/30  brain]
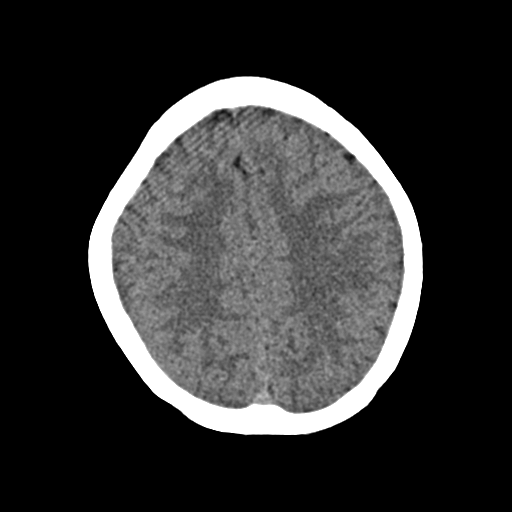
[im 19/30  bone]
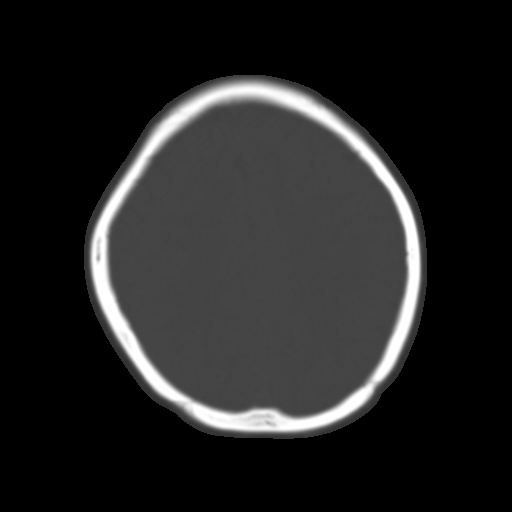
[im 22/30  brain]
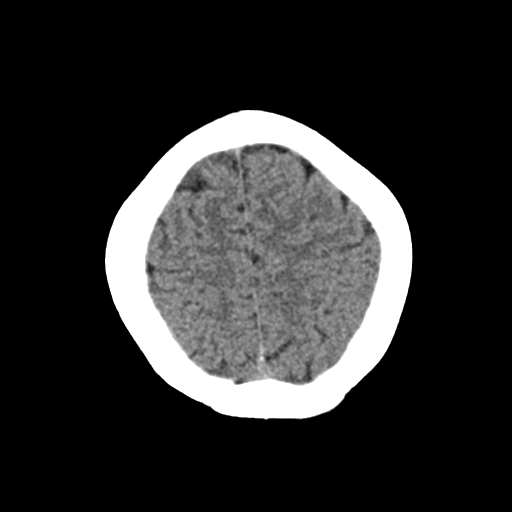
[im 26/30  brain]
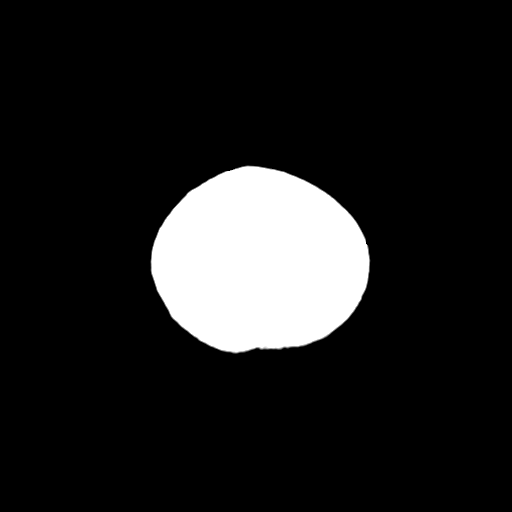

[Series 3: head bone · axial · 0.41mm/px · z∈[-506,-478]mm · 3 of 74 slices shown]
[im 8/74  bone]
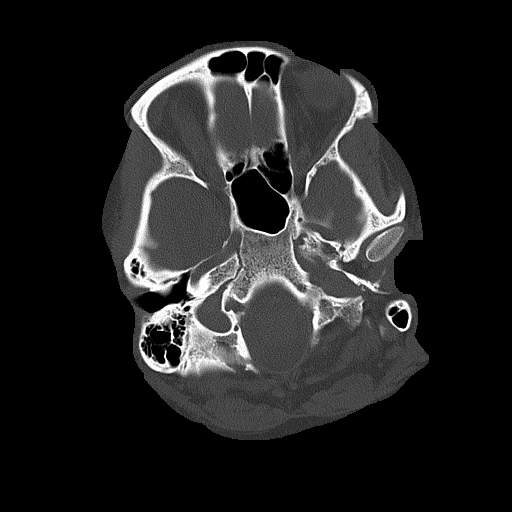
[im 15/74  bone]
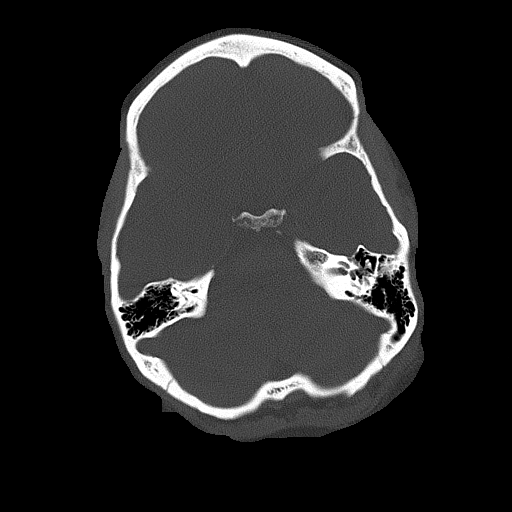
[im 22/74  bone]
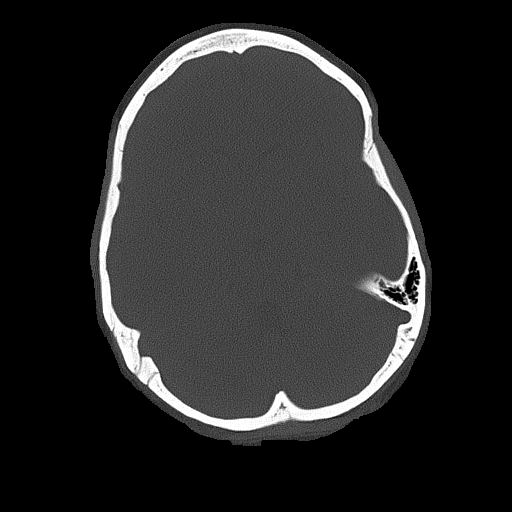

[Series 4: coronal soft · coronal · 0.29mm/px · 3 of 67 slices shown]
[im 23/67  brain]
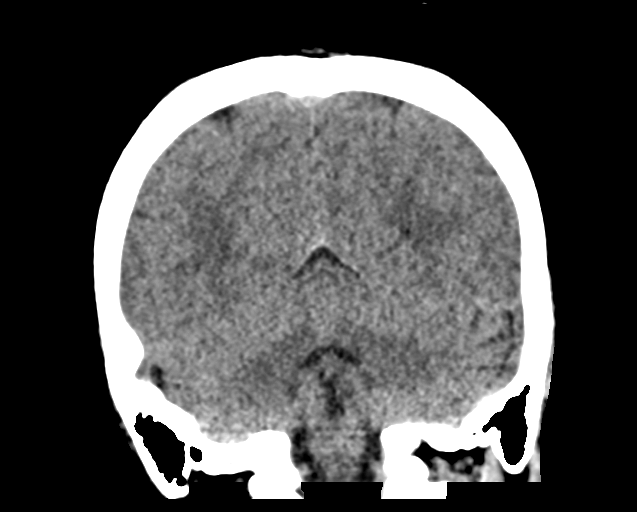
[im 30/67  brain]
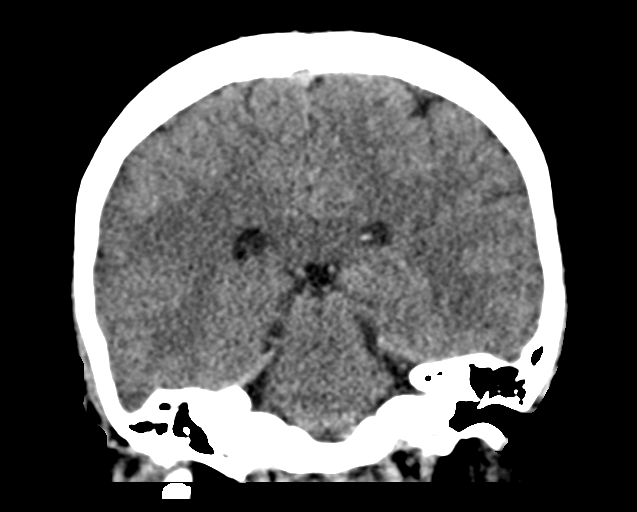
[im 37/67  brain]
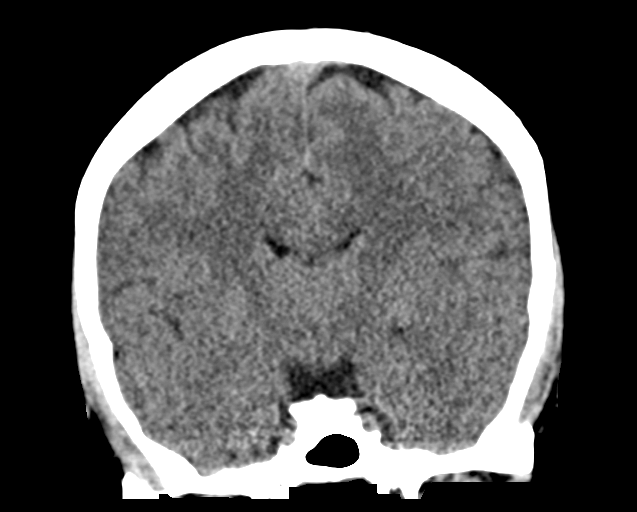

[Series 5: sag soft · sagittal · 0.29mm/px · 3 of 67 slices shown]
[im 23/67  brain]
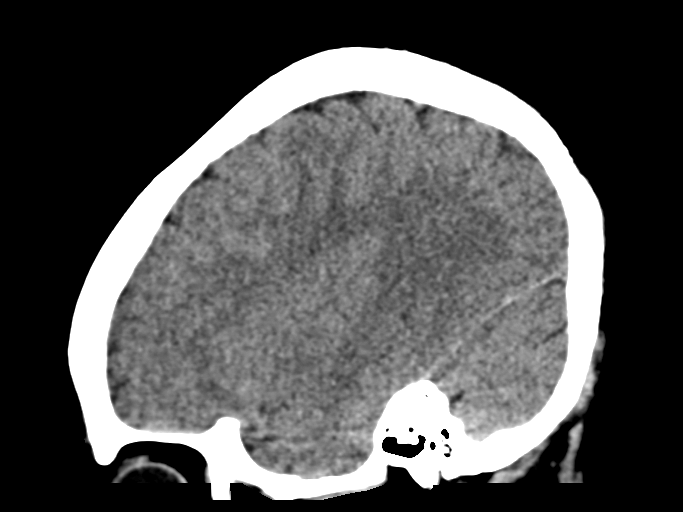
[im 34/67  brain]
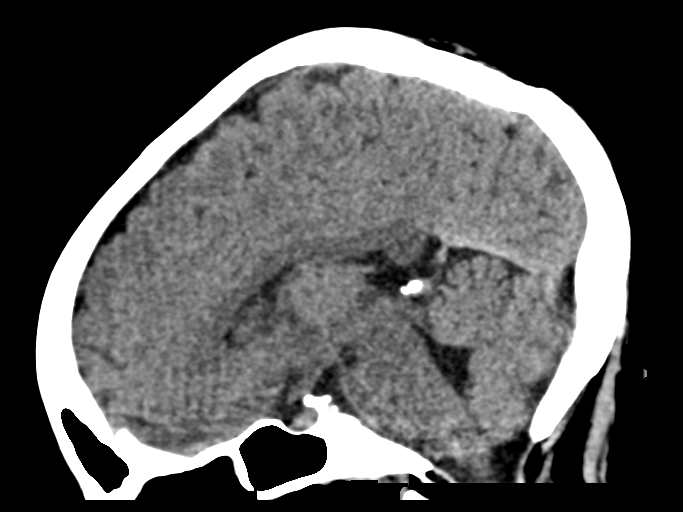
[im 45/67  brain]
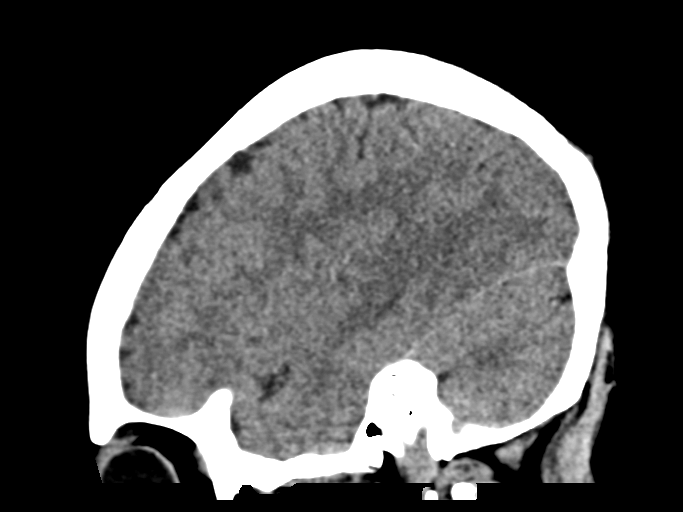

[16 of 47 positions shown; findings below may reference images not displayed]

FINDINGS: Brain: The brainstem, cerebellum, cerebral peduncles, thalami, basal
ganglia, basilar cisterns, and ventricular system appear within
normal limits. No intracranial hemorrhage, mass lesion, or acute
CVA.

Vascular: Unremarkable

Skull: Unremarkable

Sinuses/Orbits: Mild chronic left ethmoid and left sphenoid
sinusitis.

Other: Absent right posterior arch of C1, likely congenital, and
unchanged from 10/15/2006.
IMPRESSION: 1. No acute intracranial findings.
2. Absent right posterior arch of C1, likely congenital, no change
from 10/15/2006.
3. Mild chronic left ethmoid and left sphenoid sinusitis.

## 2023-11-18 ENCOUNTER — Other Ambulatory Visit: Payer: Self-pay

## 2023-11-18 ENCOUNTER — Encounter (HOSPITAL_BASED_OUTPATIENT_CLINIC_OR_DEPARTMENT_OTHER): Payer: Self-pay | Admitting: Emergency Medicine

## 2023-11-18 ENCOUNTER — Emergency Department (HOSPITAL_BASED_OUTPATIENT_CLINIC_OR_DEPARTMENT_OTHER)
Admission: EM | Admit: 2023-11-18 | Discharge: 2023-11-19 | Disposition: A | Discharge status: Home / Self Care | Attending: Emergency Medicine | Admitting: Emergency Medicine

## 2023-11-18 DIAGNOSIS — J069 Acute upper respiratory infection, unspecified: Secondary | ICD-10-CM | POA: Diagnosis not present

## 2023-11-18 DIAGNOSIS — R509 Fever, unspecified: Secondary | ICD-10-CM | POA: Diagnosis present

## 2023-11-18 LAB — GROUP A STREP BY PCR: Group A Strep by PCR: NOT DETECTED

## 2023-11-18 LAB — RESP PANEL BY RT-PCR (RSV, FLU A&B, COVID)  RVPGX2
Influenza A by PCR: NEGATIVE
Influenza B by PCR: NEGATIVE
Resp Syncytial Virus by PCR: NEGATIVE
SARS Coronavirus 2 by RT PCR: NEGATIVE

## 2023-11-18 NOTE — ED Triage Notes (Signed)
 Pt reports chills, sore throat, body aches, cough today. Also c/o ongoing headache x 1 week.   Took ibuprofen  appx 1730.

## 2023-11-19 NOTE — ED Provider Notes (Signed)
 [place] [facility] Provider Note   CSN: [id number] Arrival date & time: 11/18/23  2239     Patient presents with: URI   Laura Salinas is a 38 y.o. female.   Patient is a 38 year old female presenting with complaints of bodyaches, feeling fevered, cough, congestion.  Symptoms started yesterday.  She does report a headache throughout the week, but no weakness, numbness, or visual disturbances.  She has not tried taking anything for her symptoms.  No ill contacts.       Prior to Admission medications   Medication Sig Start Date End Date Taking? Authorizing Provider  HYDROcodone -acetaminophen  (NORCO) 5-325 MG tablet Take 1-2 tablets by mouth every 6 (six) hours as needed. 01/31/16   Geroldine Berg, MD  methocarbamol  (ROBAXIN ) 500 MG tablet Take 1 tablet (500 mg total) by mouth every 8 (eight) hours as needed for muscle spasms. 10/17/22   Theadore Ozell HERO, MD  naproxen  (NAPROSYN ) 500 MG tablet Take 1 tablet (500 mg total) by mouth 2 (two) times daily. 10/17/22   Theadore Ozell HERO, MD  ondansetron  (ZOFRAN -ODT) 4 MG disintegrating tablet Take 1 tablet (4 mg total) by mouth every 8 (eight) hours as needed for nausea or vomiting. 10/17/22   Theadore Ozell HERO, MD    Allergies: Penicillins    Review of Systems  All other systems reviewed and are negative.   Updated Vital Signs BP 110/81   Pulse 80   Temp 98.2 F (36.8 C) (Oral)   Resp 15   Ht 4' 9 (1.448 m)   Wt 93 kg   LMP 11/11/2023 (Approximate)   SpO2 98%   Breastfeeding No   BMI 44.36 kg/m   Physical Exam Vitals and nursing note reviewed.  Constitutional:      General: She is not in acute distress.    Appearance: She is well-developed. She is not diaphoretic.  HENT:     Head: Normocephalic and atraumatic.  Cardiovascular:     Rate and Rhythm: Normal rate and regular rhythm.     Heart sounds: No murmur heard.    No friction rub. No gallop.  Pulmonary:     Effort: Pulmonary effort is  normal. No respiratory distress.     Breath sounds: Normal breath sounds. No wheezing.  Abdominal:     General: Bowel sounds are normal. There is no distension.     Palpations: Abdomen is soft.     Tenderness: There is no abdominal tenderness.  Musculoskeletal:        General: Normal range of motion.     Cervical back: Normal range of motion and neck supple.  Skin:    General: Skin is warm and dry.  Neurological:     General: No focal deficit present.     Mental Status: She is alert and oriented to person, place, and time.     (all labs ordered are listed, but only abnormal results are displayed) Labs Reviewed  GROUP A STREP BY PCR  RESP PANEL BY RT-PCR (RSV, FLU A&B, COVID)  RVPGX2    EKG: None  Radiology: No results found.   Procedures   Medications Ordered in the ED - No data to display                                  Medical Decision Making  Patient presenting with URI symptoms and bodyaches as described in the HPI.  Her  COVID/flu/RSV/strep are all negative.  Still suspect a viral etiology.  Will recommend plenty of fluids, rest, Tylenol /Motrin , and return as needed.     Final diagnoses:  None    ED Discharge Orders     None          Geroldine Berg, MD 11/19/23 0002

## 2023-11-19 NOTE — Discharge Instructions (Signed)
 Take Tylenol  1000 mg rotated with ibuprofen  600 mg every 3 hours as needed for pain or fever.  Drink plenty of fluids and get plenty of rest.  Take over-the-counter medications as needed for relief of symptoms.

## 2023-11-22 ENCOUNTER — Encounter (HOSPITAL_BASED_OUTPATIENT_CLINIC_OR_DEPARTMENT_OTHER): Payer: Self-pay

## 2023-11-22 ENCOUNTER — Other Ambulatory Visit: Payer: Self-pay

## 2023-11-22 ENCOUNTER — Emergency Department (HOSPITAL_BASED_OUTPATIENT_CLINIC_OR_DEPARTMENT_OTHER)
Admission: EM | Admit: 2023-11-22 | Discharge: 2023-11-22 | Disposition: A | Discharge status: Home / Self Care | Attending: Emergency Medicine | Admitting: Emergency Medicine

## 2023-11-22 DIAGNOSIS — Z87891 Personal history of nicotine dependence: Secondary | ICD-10-CM | POA: Diagnosis not present

## 2023-11-22 DIAGNOSIS — M791 Myalgia, unspecified site: Secondary | ICD-10-CM | POA: Diagnosis not present

## 2023-11-22 DIAGNOSIS — R52 Pain, unspecified: Secondary | ICD-10-CM

## 2023-11-22 DIAGNOSIS — J029 Acute pharyngitis, unspecified: Secondary | ICD-10-CM | POA: Insufficient documentation

## 2023-11-22 LAB — RESP PANEL BY RT-PCR (RSV, FLU A&B, COVID)  RVPGX2
Influenza A by PCR: NEGATIVE
Influenza B by PCR: NEGATIVE
Resp Syncytial Virus by PCR: NEGATIVE
SARS Coronavirus 2 by RT PCR: NEGATIVE

## 2023-11-22 MED ORDER — DEXAMETHASONE 10 MG/ML FOR PEDIATRIC ORAL USE
10.0000 mg | Freq: Once | INTRAMUSCULAR | Status: AC
  Administered 2023-11-22: 10 mg via ORAL
  Filled 2023-11-22: qty 1

## 2023-11-22 MED ORDER — LIDOCAINE VISCOUS HCL 2 % MT SOLN
15.0000 mL | Freq: Once | OROMUCOSAL | Status: AC
  Administered 2023-11-22: 15 mL via OROMUCOSAL
  Filled 2023-11-22: qty 15

## 2023-11-22 MED ORDER — CELECOXIB 200 MG PO CAPS: 200.0000 mg | ORAL_CAPSULE | Freq: Two times a day (BID) | ORAL | 0 refills | Status: AC | PRN

## 2023-11-22 MED ORDER — CETIRIZINE-PSEUDOEPHEDRINE ER 5-120 MG PO TB12: 1.0000 | ORAL_TABLET | Freq: Every day | ORAL | 0 refills | Status: AC | PRN

## 2023-11-22 MED ORDER — KETOROLAC TROMETHAMINE 30 MG/ML IJ SOLN
30.0000 mg | Freq: Once | INTRAMUSCULAR | Status: AC
  Administered 2023-11-22: 30 mg via INTRAMUSCULAR
  Filled 2023-11-22: qty 1

## 2023-11-22 MED ORDER — LIDOCAINE VISCOUS HCL 2 % MT SOLN: 10.0000 mL | Freq: Four times a day (QID) | OROMUCOSAL | 0 refills | Status: AC | PRN

## 2023-11-22 NOTE — ED Provider Notes (Signed)
 [place] [facility] Provider Note   CSN: [id number] Arrival date & time: 11/22/23  1658     Patient presents with: Sore Throat   Laura Salinas is a 38 y.o. female.    Sore Throat   38 year old female presents emergency department plaints of sore throat, chills, body aches.  States that she has been having symptoms for the past few days.  Was seen on Tuesday had negative strep testing as well as viral testing.  States that she has been taking ibuprofen  and Aleve  and trying to maintain oral hydration but due to persistent symptoms, prompted visit to the emergency department.  Denies any known fevers.  Denies any chest pain, shortness of breath, cough, abdominal pain, nausea or vomiting, urinary symptoms, change in bowel habits.  No significant pertinent medical history.  Prior to Admission medications   Medication Sig Start Date End Date Taking? Authorizing Provider  HYDROcodone -acetaminophen  (NORCO) 5-325 MG tablet Take 1-2 tablets by mouth every 6 (six) hours as needed. 01/31/16   Geroldine Berg, MD  methocarbamol  (ROBAXIN ) 500 MG tablet Take 1 tablet (500 mg total) by mouth every 8 (eight) hours as needed for muscle spasms. 10/17/22   Theadore Ozell HERO, MD  naproxen  (NAPROSYN ) 500 MG tablet Take 1 tablet (500 mg total) by mouth 2 (two) times daily. 10/17/22   Theadore Ozell HERO, MD  ondansetron  (ZOFRAN -ODT) 4 MG disintegrating tablet Take 1 tablet (4 mg total) by mouth every 8 (eight) hours as needed for nausea or vomiting. 10/17/22   Theadore Ozell HERO, MD    Allergies: Penicillins    Review of Systems  All other systems reviewed and are negative.   Updated Vital Signs BP 128/82 (BP Location: Right Arm)   Pulse 84   Temp 98.9 F (37.2 C)   Resp 18   LMP 11/11/2023 (Approximate)   SpO2 99%   Physical Exam Vitals and nursing note reviewed.  Constitutional:      General: She is not in acute distress.    Appearance: She is well-developed.   HENT:     Head: Normocephalic and atraumatic.     Ears:     Comments: TMs nonerythematous.  Clear serous fluid level behind bilateral TM.    Mouth/Throat:     Comments: Moderate posterior pharyngeal erythema.  Uvula midline rises symmetrical phonation.  Tonsils 1-2+ bilaterally without obvious exudate.  Vesicular lesions appreciated Palatino arch as well as right lateral tongue.  No sublingual or submandibular swelling.  No trismus.  Patient tolerating oral secretions without difficulty. Eyes:     Conjunctiva/sclera: Conjunctivae normal.  Cardiovascular:     Rate and Rhythm: Normal rate and regular rhythm.     Heart sounds: No murmur heard. Pulmonary:     Effort: Pulmonary effort is normal. No respiratory distress.     Breath sounds: Normal breath sounds. No wheezing, rhonchi or rales.  Abdominal:     Palpations: Abdomen is soft.     Tenderness: There is no abdominal tenderness.  Musculoskeletal:        General: No swelling.     Cervical back: Neck supple.  Skin:    General: Skin is warm and dry.     Capillary Refill: Capillary refill takes less than 2 seconds.  Neurological:     Mental Status: She is alert.  Psychiatric:        Mood and Affect: Mood normal.     (all labs ordered are listed, but only abnormal results are displayed)  Labs Reviewed  RESP PANEL BY RT-PCR (RSV, FLU A&B, COVID)  RVPGX2    EKG: None  Radiology: No results found.   Procedures   Medications Ordered in the ED  dexamethasone  (DECADRON ) 10 MG/ML injection for Pediatric ORAL use 10 mg (has no administration in time range)  lidocaine  (XYLOCAINE ) 2 % viscous mouth solution 15 mL (has no administration in time range)  ketorolac  (TORADOL ) 30 MG/ML injection 30 mg (has no administration in time range)                                    Medical Decision Making Risk Prescription drug management.   This patient presents to the ED for concern of sore throat, body aches, this involves an  extensive number of treatment options, and is a complaint that carries with it a high risk of complications and morbidity.  The differential diagnosis includes viral URI, group A strep, PTA, Ludwig angina, Lemierre's disease, other   Co morbidities that complicate the patient evaluation  See HPI   Additional history obtained:  Additional history obtained from EMR External records from outside source obtained and reviewed including hospital records   Lab Tests:  I Ordered, and personally interpreted labs.  The pertinent results include: Viral testing negative   Imaging Studies ordered:  N/a   Cardiac Monitoring: / EKG:  N/a   Consultations Obtained:  N/a   Problem List / ED Course / Critical interventions / Medication management  Sore throat, body aches, ear pressure I ordered medication including dexamethasone , lidocaine , Toradol    Reevaluation of the patient after these medicines showed that the patient improved I have reviewed the patients home medicines and have made adjustments as needed   Social Determinants of Health:  Former cigarette use.  Denies illicit drug use.   Test / Admission - Considered:  Sore throat, body aches, ear pressure Vitals signs within normal range and stable throughout visit. Laboratory studies significant for: See above 38 year old female presents emergency department plaints of sore throat, chills, body aches.  States that she has been having symptoms for the past few days.  Was seen on Tuesday had negative strep testing as well as viral testing.  States that she has been taking ibuprofen  and Aleve  and trying to maintain oral hydration but due to persistent symptoms, prompted visit to the emergency department.  Denies any known fevers.  Denies any chest pain, shortness of breath, cough, abdominal pain, nausea or vomiting, urinary symptoms, change in bowel habits. On exam, moderate posterior pharyngeal erythema with symmetric tonsillar  enlargement, vesicular lesions appreciated on palate teen arches.  No clinical evidence of Ludwig angina, PTA.  Viral testing obtained by triage staff was negative.  Oropharynx consistent with herpangina.  Will recommend supportive therapy and follow-up with primary care in the outpatient setting.  Treatment plan discussed with patient and she is understanding was agreeable.  Patient well-appearing, febrile in no acute distress upon discharge. Worrisome signs and symptoms were discussed with the patient, and the patient acknowledged understanding to return to the ED if noticed. Patient was stable upon discharge.       Final diagnoses:  None    ED Discharge Orders     None          Silver Wonda LABOR, [place] 11/22/23 GUILLERMO    Lenor Hollering, MD 11/22/23 [phone]

## 2023-11-22 NOTE — ED Triage Notes (Signed)
 Dx w URI on Tuesday.  Reports continued sore throat, headache, body aches, chills

## 2023-11-22 NOTE — ED Notes (Signed)
 Pt advised throat is sore and has been since Tuesday. Has tried at home tylenol  and advil  without improvement. Has not tried any oral liquid medicines or syrups.

## 2023-11-22 NOTE — Discharge Instructions (Signed)
 You tested negative for COVID, flu, RSV.  Suspect that the lesions in the back of your throat likely secondary to viral illness.  Will send you home with medicine to take as needed for pain/inflammation.  Also sent a mouthwash which should help as well.  We will also try an allergy medicine with a decongestion and it was up with ear pressure as well as sinus pressure.  Recommend close follow-up with your primary care for reassessment.  Please not hesitate to return to the emergency department if the worrisome signs and symptoms discussed become apparent.

## 2024-03-08 ENCOUNTER — Emergency Department (HOSPITAL_COMMUNITY)
Admission: EM | Admit: 2024-03-08 | Discharge: 2024-03-08 | Disposition: A | Discharge status: Home / Self Care | Attending: Emergency Medicine | Admitting: Emergency Medicine

## 2024-03-08 ENCOUNTER — Encounter (HOSPITAL_COMMUNITY): Payer: Self-pay

## 2024-03-08 DIAGNOSIS — R509 Fever, unspecified: Secondary | ICD-10-CM | POA: Insufficient documentation

## 2024-03-08 DIAGNOSIS — R519 Headache, unspecified: Secondary | ICD-10-CM | POA: Diagnosis not present

## 2024-03-08 DIAGNOSIS — M791 Myalgia, unspecified site: Secondary | ICD-10-CM | POA: Insufficient documentation

## 2024-03-08 DIAGNOSIS — R059 Cough, unspecified: Secondary | ICD-10-CM | POA: Diagnosis present

## 2024-03-08 DIAGNOSIS — R0789 Other chest pain: Secondary | ICD-10-CM | POA: Diagnosis not present

## 2024-03-08 DIAGNOSIS — B349 Viral infection, unspecified: Secondary | ICD-10-CM

## 2024-03-08 LAB — RESP PANEL BY RT-PCR (RSV, FLU A&B, COVID)  RVPGX2
Influenza A by PCR: NEGATIVE
Influenza B by PCR: NEGATIVE
Resp Syncytial Virus by PCR: NEGATIVE
SARS Coronavirus 2 by RT PCR: NEGATIVE

## 2024-03-08 MED ORDER — ALBUTEROL SULFATE HFA 108 (90 BASE) MCG/ACT IN AERS
2.0000 | INHALATION_SPRAY | Freq: Once | RESPIRATORY_TRACT | Status: AC
  Administered 2024-03-08: 2 via RESPIRATORY_TRACT
  Filled 2024-03-08: qty 6.7

## 2024-03-08 MED ORDER — ACETAMINOPHEN 325 MG PO TABS
650.0000 mg | ORAL_TABLET | Freq: Once | ORAL | Status: AC
  Administered 2024-03-08: 650 mg via ORAL
  Filled 2024-03-08: qty 2

## 2024-03-08 NOTE — Discharge Instructions (Addendum)
 Your evaluated for upper respiratory tract infection. Your swab for covid, flu, RSV was negative but your illness is still likely due to a virus.  We evaluated your chest pressure with an EKG which is unremarkable; it is likely musculoskeletal from coughing.  Due to some wheezing noted on physical exam, you are being discharged today with an albuterol  inhaler.  Otherwise you can continue supportive treatment with Tylenol , rest, plenty of fluids, and use the albuterol  inhaler as needed.  A work note has been attached. When you return to work, continue to wear a mask. Please return back to the ED if you have any worsening of your symptoms.  Please follow-up with your primary care doctor.  Thank you for allowing me to be part of your care, I hope you start better soon.

## 2024-03-08 NOTE — ED Provider Notes (Signed)
 " [place] EMERGENC[facility] DEPARTMENT AT [facility] Provider Note   CSN: [id number] Arrival date & time: 03/08/24  9358     Patient presents with: Influenza   Laura Salinas is a 39 y.o. female.   Ms. Siegfried is a 39 year old female with no documented past medical history.  She presents today for suspected upper respiratory viral infection.  Chief complaints of headache, productive cough, runny nose, body aches, shortness of breath, chest pressure (off/on.  Occurred as well during 11/2023 URI but was worse then).  Denies fever, nausea, vomiting, abdominal pain, urinary symptoms.  Endorses chills, diarrhea.  Patient stated at home she is eating and drinking well.  She attempted TheraFlu and ibuprofen  without improvement of symptoms.  She decided to come to the ED for evaluation with worsening symptoms today.   Influenza Presenting symptoms: cough, diarrhea, myalgias, rhinorrhea and shortness of breath   Presenting symptoms: no fever, no nausea and no vomiting   Associated symptoms: chills and nasal congestion        Prior to Admission medications  Medication Sig Start Date End Date Taking? Authorizing Provider  celecoxib  (CELEBREX ) 200 MG capsule Take 1 capsule (200 mg total) by mouth 2 (two) times daily as needed. 11/22/23   Silver Wonda LABOR, PA  cetirizine -pseudoephedrine  (ZYRTEC -D) 5-120 MG tablet Take 1 tablet by mouth daily as needed. 11/22/23   Silver Wonda LABOR, PA  HYDROcodone -acetaminophen  (NORCO) 5-325 MG tablet Take 1-2 tablets by mouth every 6 (six) hours as needed. 01/31/16   Geroldine Berg, MD  magic mouthwash (lidocaine , diphenhydrAMINE, alum & mag hydroxide) suspension Swish and spit 10 mLs 4 (four) times daily as needed for mouth pain. 11/22/23   Silver Wonda LABOR, PA  methocarbamol  (ROBAXIN ) 500 MG tablet Take 1 tablet (500 mg total) by mouth every 8 (eight) hours as needed for muscle spasms. 10/17/22   Theadore Ozell HERO, MD  naproxen  (NAPROSYN ) 500 MG tablet Take 1 tablet  (500 mg total) by mouth 2 (two) times daily. 10/17/22   Theadore Ozell HERO, MD  ondansetron  (ZOFRAN -ODT) 4 MG disintegrating tablet Take 1 tablet (4 mg total) by mouth every 8 (eight) hours as needed for nausea or vomiting. 10/17/22   Theadore Ozell HERO, MD    Allergies: Penicillins    Review of Systems  Constitutional:  Positive for chills. Negative for appetite change and fever.  HENT:  Positive for congestion and rhinorrhea.   Respiratory:  Positive for cough and shortness of breath. Negative for wheezing.   Cardiovascular:  Positive for chest pain (Pressure.  Off/on). Negative for leg swelling.  Gastrointestinal:  Positive for diarrhea. Negative for abdominal pain, nausea and vomiting.  Genitourinary:  Negative for difficulty urinating and dysuria.  Musculoskeletal:  Positive for myalgias.    Updated Vital Signs BP (!) 150/106   Pulse 72   Temp 98 F (36.7 C) (Oral)   Resp 16   LMP 02/26/2024   SpO2 100%   Physical Exam Constitutional:      Appearance: She is ill-appearing.  Cardiovascular:     Rate and Rhythm: Normal rate and regular rhythm.     Heart sounds: No murmur heard.    No friction rub. No gallop.  Pulmonary:     Effort: Pulmonary effort is normal.     Breath sounds: Normal breath sounds. No stridor. No wheezing, rhonchi or rales.  Chest:     Chest wall: Tenderness (Chest pressure exacerbated with palpation) present.  Abdominal:     General: Bowel sounds are  normal.     Palpations: Abdomen is soft.     Tenderness: There is no abdominal tenderness.  Musculoskeletal:     Right lower leg: No edema.     Left lower leg: No edema.  Skin:    General: Skin is warm and dry.  Neurological:     Mental Status: She is alert.     (all labs ordered are listed, but only abnormal results are displayed) Labs Reviewed - No data to display  EKG: None  Radiology: No results found.   Procedures   Medications Ordered in the ED - No data to display                                   Medical Decision Making This patient is a 39 y.o. female who presents to the ED for concern of upper respiratory tract infection.   Past Medical History / Co-morbidities / Social History: No significant past medical history.  Additional history: Patient reports that she had upper respiratory tract infection on 11/2023 which she had very similar symptoms.  She was having chest pressure then as well but was much worse than her presentation today.   Physical Exam: Physical exam performed. The pertinent findings include:  Lungs clear to auscultation bilaterally.  Dr. Zelpha assessed the patient and noted some slight wheezing.  Lab Tests: I ordered, and personally interpreted labs.  The pertinent results include:   COVID, flu, RSV swab: Negative  Imaging studies ordered at this time.   EKG/Cardiac Monitoring:  My attending physician Dr. Ginger viewed and interpreted the EKG which showed an underlying rhythm of: Sinus rhythm. I agree with this interpretation.   Medications: I ordered medication including - Albuterol  for wheezing -Tylenol    MDM: Patient presented to the ED with likely upper respiratory tract infection.  Endorsing chills, headache, cough, runny nose, body aches, and chest pressure associated with coughing.  EKG was unremarkable and chest pressure likely 2/2 MSK.  Respiratory viral panel was negative, however still likely viral etiology.  Physical exam positive for wheezing and patient used and was discharged with albuterol  inhaler.  Encouraged symptomatic management with Tylenol , rest, fluids and patient agreeable.  Return precautions discussed and discharged dispo.  I discussed this case with my attending physician Dr. Ginger who cosigned this note including patient's presenting symptoms, physical exam, and planned diagnostics and interventions. Attending physician stated agreement with plan or made changes to plan which were implemented.      Risk OTC  drugs. Prescription drug management.       Final diagnoses:  None    ED Discharge Orders     None          Benuel Braun, DO 03/08/24 1005    Tegeler, Lonni PARAS, MD 03/09/24 1135  "

## 2024-03-08 NOTE — ED Triage Notes (Signed)
 Pt states that she has been having flu like symptoms since Thursday, cough, chest congestion, chills, fevers
